# Patient Record
Sex: Female | Born: 1960
Health system: Southern US, Community
[De-identification: ages and names within clinical notes are randomized; demographics above are authoritative.]

## PROBLEM LIST (undated history)

## (undated) DIAGNOSIS — B379 Candidiasis, unspecified: Secondary | ICD-10-CM

## (undated) DIAGNOSIS — C539 Malignant neoplasm of cervix uteri, unspecified: Secondary | ICD-10-CM

## (undated) DIAGNOSIS — E559 Vitamin D deficiency, unspecified: Secondary | ICD-10-CM

## (undated) DIAGNOSIS — L669 Cicatricial alopecia, unspecified: Secondary | ICD-10-CM

## (undated) HISTORY — DX: Cicatricial alopecia, unspecified: L66.9

## (undated) HISTORY — DX: Malignant neoplasm of cervix uteri, unspecified: C53.9

## (undated) HISTORY — PX: ABDOMINAL HYSTERECTOMY: SHX81

## (undated) HISTORY — DX: Vitamin D deficiency, unspecified: E55.9

## (undated) HISTORY — DX: Candidiasis, unspecified: B37.9

## (undated) HISTORY — PX: OTHER SURGICAL HISTORY: SHX169

---

## 1996-10-31 DIAGNOSIS — C539 Malignant neoplasm of cervix uteri, unspecified: Secondary | ICD-10-CM

## 1996-10-31 HISTORY — DX: Malignant neoplasm of cervix uteri, unspecified: C53.9

## 1997-04-02 HISTORY — PX: CERVICAL CONE BIOPSY: SUR198

## 1997-10-08 HISTORY — PX: TOTAL VAGINAL HYSTERECTOMY: SHX2548

## 1999-07-29 ENCOUNTER — Other Ambulatory Visit: Admission: RE | Admit: 1999-07-29 | Discharge: 1999-07-29 | Payer: Self-pay | Admitting: Obstetrics and Gynecology

## 2000-01-24 ENCOUNTER — Other Ambulatory Visit: Admission: RE | Admit: 2000-01-24 | Discharge: 2000-01-24 | Payer: Self-pay | Admitting: *Deleted

## 2000-02-14 ENCOUNTER — Ambulatory Visit (HOSPITAL_COMMUNITY): Admission: RE | Admit: 2000-02-14 | Discharge: 2000-02-14 | Payer: Self-pay | Admitting: Obstetrics and Gynecology

## 2000-02-14 ENCOUNTER — Encounter: Payer: Self-pay | Admitting: Obstetrics and Gynecology

## 2001-01-23 ENCOUNTER — Other Ambulatory Visit: Admission: RE | Admit: 2001-01-23 | Discharge: 2001-01-23 | Payer: Self-pay | Admitting: Obstetrics and Gynecology

## 2001-01-25 ENCOUNTER — Encounter: Payer: Self-pay | Admitting: Obstetrics and Gynecology

## 2001-01-25 ENCOUNTER — Encounter: Admission: RE | Admit: 2001-01-25 | Discharge: 2001-01-25 | Payer: Self-pay | Admitting: Obstetrics and Gynecology

## 2001-08-21 ENCOUNTER — Encounter: Payer: Self-pay | Admitting: Obstetrics and Gynecology

## 2001-08-21 ENCOUNTER — Encounter: Admission: RE | Admit: 2001-08-21 | Discharge: 2001-08-21 | Payer: Self-pay | Admitting: Obstetrics and Gynecology

## 2002-01-24 ENCOUNTER — Other Ambulatory Visit: Admission: RE | Admit: 2002-01-24 | Discharge: 2002-01-24 | Payer: Self-pay | Admitting: Obstetrics and Gynecology

## 2002-04-29 ENCOUNTER — Encounter: Admission: RE | Admit: 2002-04-29 | Discharge: 2002-04-29 | Payer: Self-pay | Admitting: Obstetrics and Gynecology

## 2002-04-29 ENCOUNTER — Encounter: Payer: Self-pay | Admitting: Obstetrics and Gynecology

## 2003-01-29 ENCOUNTER — Other Ambulatory Visit: Admission: RE | Admit: 2003-01-29 | Discharge: 2003-01-29 | Payer: Self-pay | Admitting: Obstetrics and Gynecology

## 2003-01-30 HISTORY — PX: COLPOSCOPY: SHX161

## 2003-05-20 ENCOUNTER — Encounter: Payer: Self-pay | Admitting: Obstetrics and Gynecology

## 2003-05-20 ENCOUNTER — Encounter: Admission: RE | Admit: 2003-05-20 | Discharge: 2003-05-20 | Payer: Self-pay | Admitting: Obstetrics and Gynecology

## 2003-09-08 ENCOUNTER — Other Ambulatory Visit: Admission: RE | Admit: 2003-09-08 | Discharge: 2003-09-08 | Payer: Self-pay | Admitting: Obstetrics and Gynecology

## 2004-03-15 ENCOUNTER — Other Ambulatory Visit: Admission: RE | Admit: 2004-03-15 | Discharge: 2004-03-15 | Payer: Self-pay | Admitting: Obstetrics and Gynecology

## 2004-05-28 ENCOUNTER — Encounter: Admission: RE | Admit: 2004-05-28 | Discharge: 2004-05-28 | Payer: Self-pay | Admitting: Obstetrics and Gynecology

## 2005-06-16 ENCOUNTER — Encounter: Admission: RE | Admit: 2005-06-16 | Discharge: 2005-06-16 | Payer: Self-pay | Admitting: Obstetrics and Gynecology

## 2006-04-28 ENCOUNTER — Other Ambulatory Visit: Admission: RE | Admit: 2006-04-28 | Discharge: 2006-04-28 | Payer: Self-pay | Admitting: Obstetrics & Gynecology

## 2006-06-26 ENCOUNTER — Encounter: Admission: RE | Admit: 2006-06-26 | Discharge: 2006-06-26 | Payer: Self-pay | Admitting: Obstetrics and Gynecology

## 2006-10-31 DIAGNOSIS — E559 Vitamin D deficiency, unspecified: Secondary | ICD-10-CM

## 2006-10-31 HISTORY — DX: Vitamin D deficiency, unspecified: E55.9

## 2007-05-07 ENCOUNTER — Other Ambulatory Visit: Admission: RE | Admit: 2007-05-07 | Discharge: 2007-05-07 | Payer: Self-pay | Admitting: Obstetrics and Gynecology

## 2007-07-09 ENCOUNTER — Encounter: Admission: RE | Admit: 2007-07-09 | Discharge: 2007-07-09 | Payer: Self-pay | Admitting: Obstetrics and Gynecology

## 2008-05-08 ENCOUNTER — Other Ambulatory Visit: Admission: RE | Admit: 2008-05-08 | Discharge: 2008-05-08 | Payer: Self-pay | Admitting: Obstetrics & Gynecology

## 2008-05-20 ENCOUNTER — Ambulatory Visit: Payer: Self-pay | Admitting: Sports Medicine

## 2008-05-20 DIAGNOSIS — M2550 Pain in unspecified joint: Secondary | ICD-10-CM | POA: Insufficient documentation

## 2008-05-20 DIAGNOSIS — M7750 Other enthesopathy of unspecified foot: Secondary | ICD-10-CM

## 2008-05-20 DIAGNOSIS — M25579 Pain in unspecified ankle and joints of unspecified foot: Secondary | ICD-10-CM

## 2008-05-20 DIAGNOSIS — M2020 Hallux rigidus, unspecified foot: Secondary | ICD-10-CM

## 2009-01-06 ENCOUNTER — Encounter: Admission: RE | Admit: 2009-01-06 | Discharge: 2009-01-06 | Payer: Self-pay | Admitting: Obstetrics and Gynecology

## 2010-01-11 ENCOUNTER — Encounter: Admission: RE | Admit: 2010-01-11 | Discharge: 2010-01-11 | Payer: Self-pay | Admitting: Obstetrics and Gynecology

## 2011-01-11 ENCOUNTER — Other Ambulatory Visit: Payer: Self-pay | Admitting: Obstetrics and Gynecology

## 2011-01-11 DIAGNOSIS — Z1231 Encounter for screening mammogram for malignant neoplasm of breast: Secondary | ICD-10-CM

## 2011-01-14 ENCOUNTER — Ambulatory Visit
Admission: RE | Admit: 2011-01-14 | Discharge: 2011-01-14 | Disposition: A | Discharge status: Home / Self Care | Payer: BC Managed Care – PPO | Source: Ambulatory Visit | Attending: Obstetrics and Gynecology | Admitting: Obstetrics and Gynecology

## 2011-01-14 DIAGNOSIS — Z1231 Encounter for screening mammogram for malignant neoplasm of breast: Secondary | ICD-10-CM

## 2011-07-02 HISTORY — PX: COLONOSCOPY: SHX174

## 2011-12-15 ENCOUNTER — Other Ambulatory Visit: Payer: Self-pay | Admitting: Obstetrics and Gynecology

## 2011-12-15 DIAGNOSIS — Z1231 Encounter for screening mammogram for malignant neoplasm of breast: Secondary | ICD-10-CM

## 2012-01-16 ENCOUNTER — Ambulatory Visit
Admission: RE | Admit: 2012-01-16 | Discharge: 2012-01-16 | Disposition: A | Discharge status: Home / Self Care | Payer: 59 | Source: Ambulatory Visit | Attending: Obstetrics and Gynecology | Admitting: Obstetrics and Gynecology

## 2012-01-16 DIAGNOSIS — Z1231 Encounter for screening mammogram for malignant neoplasm of breast: Secondary | ICD-10-CM

## 2012-05-29 LAB — HM PAP SMEAR: HM Pap smear: NEGATIVE

## 2012-12-21 ENCOUNTER — Other Ambulatory Visit: Payer: Self-pay | Admitting: Nurse Practitioner

## 2012-12-21 DIAGNOSIS — Z1231 Encounter for screening mammogram for malignant neoplasm of breast: Secondary | ICD-10-CM

## 2013-01-16 ENCOUNTER — Ambulatory Visit
Admission: RE | Admit: 2013-01-16 | Discharge: 2013-01-16 | Disposition: A | Discharge status: Home / Self Care | Payer: 59 | Source: Ambulatory Visit | Attending: Nurse Practitioner | Admitting: Nurse Practitioner

## 2013-01-16 DIAGNOSIS — Z1231 Encounter for screening mammogram for malignant neoplasm of breast: Secondary | ICD-10-CM

## 2013-06-14 ENCOUNTER — Encounter: Payer: Self-pay | Admitting: *Deleted

## 2013-06-18 ENCOUNTER — Encounter: Payer: Self-pay | Admitting: Nurse Practitioner

## 2013-06-18 ENCOUNTER — Ambulatory Visit (INDEPENDENT_AMBULATORY_CARE_PROVIDER_SITE_OTHER): Payer: 59 | Admitting: Nurse Practitioner

## 2013-06-18 VITALS — BP 112/50 | HR 66 | Resp 12 | Ht 68.5 in | Wt 125.8 lb

## 2013-06-18 DIAGNOSIS — Z0000 Encounter for general adult medical examination without abnormal findings: Secondary | ICD-10-CM

## 2013-06-18 DIAGNOSIS — Z01419 Encounter for gynecological examination (general) (routine) without abnormal findings: Secondary | ICD-10-CM

## 2013-06-18 LAB — POCT URINALYSIS DIPSTICK: Spec Grav, UA: 1.015

## 2013-06-18 NOTE — Progress Notes (Signed)
52 y.o. G0P0 Married Caucasian Fe here for annual exam.  No new problems. Some vaginal dryness, rare if any vaso symptoms.  She has bilateral foot pain at the base of first metarsals.  She would like further evaluation.  Mother still living with her and has an Proofreader for 4 hours daily.  No LMP recorded. Patient has had a hysterectomy.          Sexually active: yes  The current method of family planning is status post hysterectomy.    Exercising: yes  cardio and yoga  Smoker:  no  Health Maintenance: Pap:  05/29/2012  Negative  MMG:  03/2013 normal Colonoscopy:  07/2011 normal BMD:   never TDaP:  05/24/2011 Labs: Hgb- 13.3    reports that she has never smoked. She has never used smokeless tobacco. She reports that she drinks about 2.5 ounces of alcohol per week. She reports that she does not use illicit drugs.  Past Medical History  Diagnosis Date  . Vitamin D deficiency   . Yeast infection     chronic yeast infection   . Cervical cancer     Past Surgical History  Procedure Laterality Date  . Total vaginal hysterectomy  1998  . Colposcopy      Current Outpatient Prescriptions  Medication Sig Dispense Refill  . Biotin 10 MG CAPS Take by mouth.      . Calcium Carb-Cholecalciferol (CALCIUM 1000 + D PO) Take by mouth.      . cholecalciferol (VITAMIN D) 1000 UNITS tablet Take 1,000 Units by mouth daily.      . fish oil-omega-3 fatty acids 1000 MG capsule Take 2 g by mouth daily.      . Multiple Vitamin (MULTIVITAMIN) tablet Take 1 tablet by mouth daily.       No current facility-administered medications for this visit.    Family History  Problem Relation Age of Onset  . Hypertension Maternal Aunt   . Cancer Paternal Aunt     ovarian cancer   . Diabetes Maternal Grandmother   . Diabetes Paternal Grandmother     ROS:  Pertinent items are noted in HPI.  Otherwise, a comprehensive ROS was negative.  Exam:   BP 112/50  Pulse 66  Resp 12  Ht 5' 8.5" (1.74 m)  Wt  125 lb 12.8 oz (57.063 kg)  BMI 18.85 kg/m2 Height: 5' 8.5" (174 cm)  Ht Readings from Last 3 Encounters:  06/18/13 5' 8.5" (1.74 m)  05/20/08 5\' 9"  (1.753 m)    General appearance: alert, cooperative and appears stated age Head: Normocephalic, without obvious abnormality, atraumatic Neck: no adenopathy, supple, symmetrical, trachea midline and thyroid normal to inspection and palpation Lungs: clear to auscultation bilaterally Breasts: normal appearance, no masses or tenderness Heart: regular rate and rhythm Abdomen: soft, non-tender; no masses,  no organomegaly Extremities: extremities normal, atraumatic, no cyanosis or edema, bilateral calcifications at first metatarsals.  Skin: Skin color, texture, turgor normal. No rashes or lesions Lymph nodes: Cervical, supraclavicular, and axillary nodes normal. No abnormal inguinal nodes palpated Neurologic: Grossly normal   Pelvic: External genitalia:  no lesions              Urethra:  normal appearing urethra with no masses, tenderness or lesions              Bartholin's and Skene's: normal                 Vagina: normal appearing vagina with normal color and  discharge, no lesions              Cervix: absent              Pap taken: yes Bimanual Exam:  Uterus:  uterus absent              Adnexa: no mass, fullness, tenderness               Rectovaginal: Confirms               Anus:  normal sphincter tone, no lesions  A:  Well Woman with normal exam  S/P TVH secondary to CIS 1998  S/P Colpo Biopsy 4/04 - VAIN I  Bilateral foot pain  P:   Pap smear as per guidelines For 20 years  Mammogram due 03/2014  Given the name of [facility] to call about foot pain.  Counseled on breast self exam, adequate intake of calcium and vitamin D, diet and exercise return annually or prn  An After Visit Summary was printed and given to the patient.

## 2013-06-18 NOTE — Patient Instructions (Addendum)

## 2013-06-19 NOTE — Progress Notes (Signed)
Encounter reviewed by Dr. Brook Silva.  

## 2013-07-10 ENCOUNTER — Telehealth: Payer: Self-pay | Admitting: Nurse Practitioner

## 2013-07-10 NOTE — Telephone Encounter (Signed)
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 Please call and discuss this with patient . Patient said she informed us that she needed [place] to send it to [facility]. Lab wasn't aware.

## 2013-07-23 ENCOUNTER — Telehealth: Payer: Self-pay | Admitting: Nurse Practitioner

## 2013-07-23 NOTE — Telephone Encounter (Signed)
patient was informed that we send pap's to [facility] since we know the pathologist.  But if a patient states only [facility] we can call a courier for it to be sent there. She understands and is appreciative.

## 2013-07-23 NOTE — Telephone Encounter (Signed)
See next phone note.

## 2014-01-01 ENCOUNTER — Other Ambulatory Visit: Payer: Self-pay

## 2014-01-01 DIAGNOSIS — Z1231 Encounter for screening mammogram for malignant neoplasm of breast: Secondary | ICD-10-CM

## 2014-01-21 ENCOUNTER — Ambulatory Visit
Admission: RE | Admit: 2014-01-21 | Discharge: 2014-01-21 | Disposition: A | Discharge status: Home / Self Care | Payer: 59 | Source: Ambulatory Visit

## 2014-01-21 DIAGNOSIS — Z1231 Encounter for screening mammogram for malignant neoplasm of breast: Secondary | ICD-10-CM

## 2014-06-23 ENCOUNTER — Ambulatory Visit (INDEPENDENT_AMBULATORY_CARE_PROVIDER_SITE_OTHER): Payer: 59 | Admitting: Nurse Practitioner

## 2014-06-23 ENCOUNTER — Encounter: Payer: Self-pay | Admitting: Nurse Practitioner

## 2014-06-23 VITALS — BP 116/72 | HR 60 | Ht 68.25 in | Wt 126.0 lb

## 2014-06-23 DIAGNOSIS — Z0000 Encounter for general adult medical examination without abnormal findings: Secondary | ICD-10-CM

## 2014-06-23 DIAGNOSIS — Z01419 Encounter for gynecological examination (general) (routine) without abnormal findings: Secondary | ICD-10-CM

## 2014-06-23 LAB — POCT URINALYSIS DIPSTICK
Bilirubin, UA: NEGATIVE
GLUCOSE UA: NEGATIVE
Ketones, UA: NEGATIVE
NITRITE UA: NEGATIVE
Protein, UA: NEGATIVE
RBC UA: NEGATIVE
UROBILINOGEN UA: NEGATIVE
pH, UA: 6

## 2014-06-23 NOTE — Patient Instructions (Signed)

## 2014-06-23 NOTE — Progress Notes (Signed)
53 y.o. G0P0 Married Caucasian Fe here for annual exam.  No new health diagnosis. She feels well.  Patient's last menstrual period was 09/30/1997.          Sexually active: Yes.    The current method of family planning is status post hysterectomy.    Exercising: Yes.    Home exercise routine includes jogging and yoga. Smoker:  no  Health Maintenance: Pap:  06/18/13  HR HPV neg MMG:  01/23/14 Bi-Rads Neg Colonoscopy:  07/2011 Normal BMD:   never TDaP:  05/24/11 Labs: Work             UA: WBC Trace ph: 6.0 no symptoms.   reports that she has never smoked. She has never used smokeless tobacco. She reports that she drinks about 2.5 ounces of alcohol per week. She reports that she does not use illicit drugs.  Past Medical History  Diagnosis Date  . Vitamin D deficiency 2008  . Yeast infection     chronic yeast infection   . Cervical cancer 1998    CIS    Past Surgical History  Procedure Laterality Date  . Total vaginal hysterectomy  1998    CIS of cervix  . Colposcopy  4/04    vain I  . Colonoscopy  07/2011    normal repeat in 10 years    Current Outpatient Prescriptions  Medication Sig Dispense Refill  . Biotin 10 MG CAPS Take by mouth.      . Calcium Carb-Cholecalciferol (CALCIUM 1000 + D PO) Take by mouth.      . cholecalciferol (VITAMIN D) 1000 UNITS tablet Take 1,000 Units by mouth daily.      . fish oil-omega-3 fatty acids 1000 MG capsule Take 2 g by mouth daily.      . Multiple Vitamin (MULTIVITAMIN) tablet Take 1 tablet by mouth daily.       No current facility-administered medications for this visit.    Family History  Problem Relation Age of Onset  . Hypertension Maternal Aunt   . Cancer Paternal Aunt     ovarian cancer   . Diabetes Maternal Grandmother   . Diabetes Paternal Grandmother   . Thyroid disease Mother   . Heart failure Paternal Grandfather     ROS:  Pertinent items are noted in HPI.  Otherwise, a comprehensive ROS was negative.  Exam:   BP  116/72  Pulse 60  Ht 5' 8.25" (1.734 m)  Wt 126 lb (57.153 kg)  BMI 19.01 kg/m2  LMP 09/30/1997 Height: 5' 8.25" (173.4 cm)  Ht Readings from Last 3 Encounters:  06/23/14 5' 8.25" (1.734 m)  06/18/13 5' 8.5" (1.74 m)  05/20/08 5\' 9"  (1.753 m)    General appearance: alert, cooperative and appears stated age Head: Normocephalic, without obvious abnormality, atraumatic Neck: no adenopathy, supple, symmetrical, trachea midline and thyroid normal to inspection and palpation Lungs: clear to auscultation bilaterally Breasts: normal appearance, no masses or tenderness Heart: regular rate and rhythm Abdomen: soft, non-tender; no masses,  no organomegaly Extremities: extremities normal, atraumatic, no cyanosis or edema Skin: Skin color, texture, turgor normal. No rashes or lesions Lymph nodes: Cervical, supraclavicular, and axillary nodes normal. No abnormal inguinal nodes palpated Neurologic: Grossly normal   Pelvic: External genitalia:  no lesions              Urethra:  normal appearing urethra with no masses, tenderness or lesions              Bartholin's and  Skene's: normal                 Vagina: some atrophic changes appearing vagina with normal color and discharge, no lesions              Cervix: absent              Pap taken: Yes.   Bimanual Exam:  Uterus:  uterus absent              Adnexa: no mass, fullness, tenderness               Rectovaginal: Confirms               Anus:  normal sphincter tone, no lesions  A:  Well Woman with normal exam  S/P CIS with TVH 1998 (pap for 20 years)    P:   Reviewed health and wellness pertinent to exam  Pap smear taken today  Mammogram is due 3/16  Counseled on breast self exam, mammography screening, adequate intake of calcium and vitamin D, diet and exercise return annually or prn  An After Visit Summary was printed and given to the patient.

## 2014-06-27 LAB — IPS PAP TEST WITH REFLEX TO HPV

## 2014-06-29 NOTE — Progress Notes (Signed)
Encounter reviewed by Dr. Brook Silva.  

## 2014-12-23 ENCOUNTER — Other Ambulatory Visit: Payer: Self-pay

## 2014-12-23 DIAGNOSIS — Z1231 Encounter for screening mammogram for malignant neoplasm of breast: Secondary | ICD-10-CM

## 2015-01-30 HISTORY — PX: METATARSAL OSTEOTOMY: SHX1641

## 2015-02-06 ENCOUNTER — Ambulatory Visit: Payer: Self-pay

## 2015-02-18 ENCOUNTER — Ambulatory Visit: Payer: Self-pay

## 2015-02-19 ENCOUNTER — Ambulatory Visit
Admit: 2015-02-19 | Disposition: A | Discharge status: Home / Self Care | Payer: Self-pay | Attending: Podiatry | Admitting: Podiatry

## 2015-02-23 LAB — SURGICAL PATHOLOGY

## 2015-03-09 ENCOUNTER — Ambulatory Visit
Admission: RE | Admit: 2015-03-09 | Discharge: 2015-03-09 | Disposition: A | Discharge status: Home / Self Care | Payer: 59 | Source: Ambulatory Visit

## 2015-03-09 DIAGNOSIS — Z1231 Encounter for screening mammogram for malignant neoplasm of breast: Secondary | ICD-10-CM

## 2015-06-25 ENCOUNTER — Ambulatory Visit: Payer: Self-pay | Admitting: Nurse Practitioner

## 2015-06-29 ENCOUNTER — Encounter: Payer: Self-pay | Admitting: Nurse Practitioner

## 2015-06-29 ENCOUNTER — Ambulatory Visit (INDEPENDENT_AMBULATORY_CARE_PROVIDER_SITE_OTHER): Payer: 59 | Admitting: Nurse Practitioner

## 2015-06-29 VITALS — BP 90/62 | HR 56 | Ht 68.5 in | Wt 128.0 lb

## 2015-06-29 DIAGNOSIS — Z0000 Encounter for general adult medical examination without abnormal findings: Secondary | ICD-10-CM | POA: Diagnosis not present

## 2015-06-29 DIAGNOSIS — Z01419 Encounter for gynecological examination (general) (routine) without abnormal findings: Secondary | ICD-10-CM | POA: Diagnosis not present

## 2015-06-29 LAB — POCT URINALYSIS DIPSTICK
BILIRUBIN UA: NEGATIVE
Blood, UA: NEGATIVE
Glucose, UA: NEGATIVE
Ketones, UA: NEGATIVE
LEUKOCYTES UA: NEGATIVE
NITRITE UA: NEGATIVE
PH UA: 5
PROTEIN UA: NEGATIVE
UROBILINOGEN UA: NEGATIVE

## 2015-06-29 NOTE — Progress Notes (Signed)
Patient ID: Natasha Mccann, female   DOB: 12-Aug-1961, 54 y.o.   MRN: [id number] 54 y.o. G0P0 Married  Caucasian Fe here for annual exam.  She feels well.   The only new problem this past year was a repair of  Hallux rigidus of left foot with condylectomy and metatarsal osteotomy.  She normally gets labs done thru work but does not get thyroid checked.  Will get Thryroid panel and TSH at [facility] and have results sent here.  Patient's last menstrual period was 09/30/1997 (approximate).          Sexually active: Yes.    The current method of family planning is status post hysterectomy.    Exercising: Yes.    running 3 miles 5 days per week, yoga and pilates Smoker:  no  Health Maintenance: Pap: 06/24/14, negative (previous neg HR HPV) MMG: 03/09/15, 3D, Bi-Rads 1:  Negative Colonoscopy: 07/2011 Normal, repeat in 10 years TDaP: 05/24/11 Labs: Work   Urine:  Negative     reports that she has never smoked. She has never used smokeless tobacco. She reports that she drinks about 2.5 oz of alcohol per week. She reports that she does not use illicit drugs.  Past Medical History  Diagnosis Date  . Vitamin D deficiency 2008  . Yeast infection     chronic yeast infection   . Cervical cancer 1998    CIS    Past Surgical History  Procedure Laterality Date  . Total vaginal hysterectomy  1998    CIS of cervix  . Colposcopy  4/04    vain I  . Colonoscopy  07/2011    normal repeat in 10 years  . Metatarsal osteotomy Left 01/2015    repair of Hallux rigidus with Condylectomy    Current Outpatient Prescriptions  Medication Sig Dispense Refill  . Biotin 10 MG CAPS Take by mouth.    . Calcium Carb-Cholecalciferol (CALCIUM 1000 + D PO) Take by mouth.    . cholecalciferol (VITAMIN D) 1000 UNITS tablet Take 1,000 Units by mouth daily.    . fish oil-omega-3 fatty acids 1000 MG capsule Take 2 g by mouth daily.    . Multiple Vitamin (MULTIVITAMIN) tablet Take 1 tablet by mouth daily.     No current  facility-administered medications for this visit.    Family History  Problem Relation Age of Onset  . Hypertension Maternal Aunt   . Pancreatic cancer Maternal Aunt 92  . Cancer Paternal Aunt     ovarian cancer   . Diabetes Maternal Grandmother   . Diabetes Paternal Grandmother   . Thyroid disease Mother   . Heart failure Paternal Grandfather   . Stomach cancer Cousin 35    ROS:  Pertinent items are noted in HPI.  Otherwise, a comprehensive ROS was negative.  Exam:   BP 90/62 mmHg  Pulse 56  Ht 5' 8.5" (1.74 m)  Wt 128 lb (58.06 kg)  BMI 19.18 kg/m2  LMP 09/30/1997 (Approximate) Height: 5' 8.5" (174 cm) Ht Readings from Last 3 Encounters:  06/29/15 5' 8.5" (1.74 m)  06/23/14 5' 8.25" (1.734 m)  06/18/13 5' 8.5" (1.74 m)    General appearance: alert, cooperative and appears stated age Head: Normocephalic, without obvious abnormality, atraumatic Neck: no adenopathy, supple, symmetrical, trachea midline and thyroid normal to inspection and palpation Lungs: clear to auscultation bilaterally Breasts: normal appearance, no masses or tenderness Heart: regular rate and rhythm Abdomen: soft, non-tender; no masses,  no organomegaly Extremities: extremities normal, atraumatic,  no cyanosis or edema Skin: Skin color, texture, turgor normal. No rashes or lesions Lymph nodes: Cervical, supraclavicular, and axillary nodes normal. No abnormal inguinal nodes palpated Neurologic: Grossly normal   Pelvic: External genitalia:  no lesions              Urethra:  normal appearing urethra with no masses, tenderness or lesions              Bartholin's and Skene's: normal                 Vagina: normal appearing vagina with normal color and discharge, no lesions              Cervix: absent              Pap taken: Yes.   Bimanual Exam:  Uterus:  uterus absent              Adnexa: no mass, fullness, tenderness               Rectovaginal: Confirms               Anus:  normal sphincter tone,  no lesions  Chaperone present: yes  A:  Well Woman with normal exam  S/P CIS with TVH 09/1997 (pap for 20 years)  S/P left foot surgery 01/2015  P:   Reviewed health and wellness pertinent to exam  Pap smear as above  Mammogram is due 5/17  Counseled on breast self exam, mammography screening, adequate intake of calcium and vitamin D, diet and exercise return annually or prn  An After Visit Summary was printed and given to the patient.

## 2015-06-29 NOTE — Patient Instructions (Signed)

## 2015-06-30 ENCOUNTER — Encounter: Payer: Self-pay | Admitting: Nurse Practitioner

## 2015-07-01 LAB — IPS PAP TEST WITH HPV

## 2015-07-01 NOTE — Progress Notes (Signed)
Encounter reviewed by Dr. Brook Amundson C. Silva.  

## 2016-02-03 ENCOUNTER — Other Ambulatory Visit: Payer: Self-pay

## 2016-02-03 DIAGNOSIS — Z1231 Encounter for screening mammogram for malignant neoplasm of breast: Secondary | ICD-10-CM

## 2016-03-11 ENCOUNTER — Ambulatory Visit
Admission: RE | Admit: 2016-03-11 | Discharge: 2016-03-11 | Disposition: A | Discharge status: Home / Self Care | Payer: 59 | Source: Ambulatory Visit

## 2016-03-11 DIAGNOSIS — Z1231 Encounter for screening mammogram for malignant neoplasm of breast: Secondary | ICD-10-CM

## 2016-04-30 DIAGNOSIS — L669 Cicatricial alopecia, unspecified: Secondary | ICD-10-CM

## 2016-04-30 HISTORY — DX: Cicatricial alopecia, unspecified: L66.9

## 2016-06-30 ENCOUNTER — Encounter: Payer: Self-pay | Admitting: Nurse Practitioner

## 2016-06-30 NOTE — Progress Notes (Signed)
Patient ID: Natasha Mccann, female   DOB: 1961/01/21, 55 y.o.   MRN: [id number]  55 y.o. G0P0000 Married  Caucasian Fe here for annual exam.  Diagnosed with lichen planus with hair loss with a biopsy.  She was given Plaquenil but decided against due to potential SE.  Mothers dementia is getting worse she is 76.  Patient's last menstrual period was 09/30/1997 (approximate).          Sexually active: Yes.    The current method of family planning is status post hysterectomy.    Exercising: Yes.  Running 3 miles 4 days per week, piliates and yoga Smoker:  no  Health Maintenance: Pap:06/29/15, Negative with neg HR HPV MMG: 03/11/16, 3D, Bi-Rads 1: Negative  Colonoscopy:07/2011 Normal will get done this year [id number] TDaP:05/24/11 Shingles: Not indicated due to age Pneumonia: Not indicated due to age Hep C and HIV: No Labs: work Musician   reports that she has never smoked. She has never used smokeless tobacco. She reports that she drinks about 2.5 oz of alcohol per week . She reports that she does not use drugs.  Past Medical History:  Diagnosis Date  . Cervical cancer ([place]) 1998   CIS  . Scarring alopecia 04/2016  . Vitamin D deficiency 2008  . Yeast infection    chronic yeast infection     Past Surgical History:  Procedure Laterality Date  . CERVICAL CONE BIOPSY  04/02/1997   Squamous cell caarcinoma in situ of cervix  . COLONOSCOPY  07/2011   normal repeat in 10 years  . COLPOSCOPY  4/04   vain I  . METATARSAL OSTEOTOMY Left 01/2015   repair of Hallux rigidus with Condylectomy  . TOTAL VAGINAL HYSTERECTOMY  10/08/1997   CIS [facility] cervix, [facility]    Current Outpatient Prescriptions  Medication Sig Dispense Refill  . Biotin 10 MG CAPS Take by mouth.    . Calcium Carb-Cholecalciferol (CALCIUM 1000 + D PO) Take by mouth.    . cholecalciferol (VITAMIN D) 1000 UNITS tablet Take 1,000 Units by mouth daily.    . clobetasol (OLUX) 0.05 % topical foam     . fish  oil-omega-3 fatty acids 1000 MG capsule Take 2 g by mouth daily.    Marland Kitchen HALOG 0.1 % CREA     . Multiple Vitamin (MULTIVITAMIN) tablet Take 1 tablet by mouth daily.     No current facility-administered medications for this visit.     Family History  Problem Relation Age of Onset  . Hypertension Maternal Aunt   . Pancreatic cancer Maternal Aunt 92  . Thyroid disease Mother   . Stomach cancer Cousin 66  . Cancer Paternal Aunt     ovarian cancer   . Diabetes Maternal Grandmother   . Diabetes Paternal Grandmother   . Heart failure Paternal Grandfather     ROS:  Pertinent items are noted in HPI.  Otherwise, a comprehensive ROS was negative.  Exam:   BP 94/62 (BP Location: Right Arm, Patient Position: Sitting, Cuff Size: Normal)   Pulse 60   Ht 5\' 8"  (1.727 m)   Wt 126 lb (57.2 kg)   LMP 09/30/1997 (Approximate)   BMI 19.16 kg/m  Height: 5\' 8"  (172.7 cm) Ht Readings from Last 3 Encounters:  07/01/16 5\' 8"  (1.727 m)  06/29/15 5' 8.5" (1.74 m)  06/23/14 5' 8.25" (1.734 m)    General appearance: alert, cooperative and appears stated age Head: Normocephalic, without obvious abnormality, atraumatic Neck: no adenopathy, supple,  symmetrical, trachea midline and thyroid normal to inspection and palpation Lungs: clear to auscultation bilaterally Breasts: normal appearance, no masses or tenderness Heart: regular rate and rhythm Abdomen: soft, non-tender; no masses,  no organomegaly Extremities: extremities normal, atraumatic, no cyanosis or edema Skin: Skin color, texture, turgor normal. No rashes or lesions Lymph nodes: Cervical, supraclavicular, and axillary nodes normal. No abnormal inguinal nodes palpated Neurologic: Grossly normal   Pelvic: External genitalia:  no lesions              Urethra:  normal appearing urethra with no masses, tenderness or lesions              Bartholin's and Skene's: normal                 Vagina: normal appearing vagina with normal color and  discharge, no lesions              Cervix: absent              Pap taken: Yes.   Bimanual Exam:  Uterus:  uterus absent              Adnexa: no mass, fullness, tenderness               Rectovaginal: Confirms               Anus:  normal sphincter tone, no lesions  Chaperone present: yes  A:  Well Woman with normal exam  S/P CIS with TVH 09/1997 (pap for 20 years)             S/P left foot surgery 01/2015  P:   Reviewed health and wellness pertinent to exam  Pap smear sent to [facility] is due 02/2017  Counseled on breast self exam, adequate intake of calcium and vitamin D, diet and exercise return annually or prn  An After Visit Summary was printed and given to the patient.

## 2016-07-01 ENCOUNTER — Encounter: Payer: Self-pay | Admitting: Nurse Practitioner

## 2016-07-01 ENCOUNTER — Ambulatory Visit (INDEPENDENT_AMBULATORY_CARE_PROVIDER_SITE_OTHER): Payer: 59 | Admitting: Nurse Practitioner

## 2016-07-01 VITALS — BP 94/62 | HR 60 | Ht 68.0 in | Wt 126.0 lb

## 2016-07-01 DIAGNOSIS — Z01419 Encounter for gynecological examination (general) (routine) without abnormal findings: Secondary | ICD-10-CM | POA: Diagnosis not present

## 2016-07-01 DIAGNOSIS — Z0000 Encounter for general adult medical examination without abnormal findings: Secondary | ICD-10-CM

## 2016-07-01 NOTE — Patient Instructions (Signed)

## 2016-07-01 NOTE — Progress Notes (Signed)
Reviewed personally.  M. Suzanne Robie Mcniel, MD.  

## 2016-07-08 LAB — PAP IG AND HPV HIGH-RISK
HPV, HIGH-RISK: NEGATIVE
PAP Smear Comment: 0

## 2017-02-15 ENCOUNTER — Other Ambulatory Visit: Payer: Self-pay | Admitting: Nurse Practitioner

## 2017-02-15 DIAGNOSIS — Z1231 Encounter for screening mammogram for malignant neoplasm of breast: Secondary | ICD-10-CM

## 2017-03-20 ENCOUNTER — Ambulatory Visit: Payer: Self-pay

## 2017-05-15 ENCOUNTER — Telehealth: Payer: Self-pay | Admitting: Obstetrics and Gynecology

## 2017-05-15 NOTE — Telephone Encounter (Signed)
LMTCB/:NP/ .CX/LETTER SENT/RD

## 2017-06-12 ENCOUNTER — Ambulatory Visit
Admission: RE | Admit: 2017-06-12 | Discharge: 2017-06-12 | Disposition: A | Discharge status: Home / Self Care | Payer: BLUE CROSS/BLUE SHIELD | Source: Ambulatory Visit | Attending: Nurse Practitioner | Admitting: Nurse Practitioner

## 2017-06-12 DIAGNOSIS — Z1231 Encounter for screening mammogram for malignant neoplasm of breast: Secondary | ICD-10-CM

## 2017-06-14 ENCOUNTER — Other Ambulatory Visit: Payer: Self-pay | Admitting: Nurse Practitioner

## 2017-06-14 ENCOUNTER — Other Ambulatory Visit: Payer: Self-pay | Admitting: Obstetrics and Gynecology

## 2017-06-15 ENCOUNTER — Other Ambulatory Visit: Payer: Self-pay | Admitting: Obstetrics and Gynecology

## 2017-06-15 DIAGNOSIS — R928 Other abnormal and inconclusive findings on diagnostic imaging of breast: Secondary | ICD-10-CM

## 2017-06-19 ENCOUNTER — Ambulatory Visit: Payer: Self-pay

## 2017-06-19 ENCOUNTER — Ambulatory Visit
Admission: RE | Admit: 2017-06-19 | Discharge: 2017-06-19 | Disposition: A | Discharge status: Home / Self Care | Payer: BLUE CROSS/BLUE SHIELD | Source: Ambulatory Visit | Attending: Obstetrics and Gynecology | Admitting: Obstetrics and Gynecology

## 2017-06-19 DIAGNOSIS — R928 Other abnormal and inconclusive findings on diagnostic imaging of breast: Secondary | ICD-10-CM

## 2017-07-05 ENCOUNTER — Ambulatory Visit (INDEPENDENT_AMBULATORY_CARE_PROVIDER_SITE_OTHER): Payer: BLUE CROSS/BLUE SHIELD | Admitting: Certified Nurse Midwife

## 2017-07-05 ENCOUNTER — Other Ambulatory Visit (HOSPITAL_COMMUNITY)
Admission: RE | Admit: 2017-07-05 | Discharge: 2017-07-05 | Disposition: A | Discharge status: Home / Self Care | Payer: BLUE CROSS/BLUE SHIELD | Source: Ambulatory Visit | Attending: Obstetrics & Gynecology | Admitting: Obstetrics & Gynecology

## 2017-07-05 ENCOUNTER — Encounter: Payer: Self-pay | Admitting: Certified Nurse Midwife

## 2017-07-05 VITALS — BP 114/70 | HR 68 | Resp 16 | Ht 68.0 in | Wt 128.0 lb

## 2017-07-05 DIAGNOSIS — Z0000 Encounter for general adult medical examination without abnormal findings: Secondary | ICD-10-CM | POA: Diagnosis not present

## 2017-07-05 DIAGNOSIS — Z01419 Encounter for gynecological examination (general) (routine) without abnormal findings: Secondary | ICD-10-CM | POA: Diagnosis not present

## 2017-07-05 DIAGNOSIS — E2839 Other primary ovarian failure: Secondary | ICD-10-CM

## 2017-07-05 DIAGNOSIS — Z124 Encounter for screening for malignant neoplasm of cervix: Secondary | ICD-10-CM

## 2017-07-05 NOTE — Patient Instructions (Signed)

## 2017-07-05 NOTE — Progress Notes (Signed)
56 y.o. G0P0000 Married  Caucasian Fe here for annual exam. Menopausal no HRT. Denies any vaginal bleeding or dryness. No menopausal symptoms now. Sees Urgent care if needed. No visits in past year. Has yearly labs at work and will send copy to review. No other health issues today.   Patient's last menstrual period was 09/30/1997 (approximate).          Sexually active: Yes.    The current method of family planning is status post hysterectomy.    Exercising: Yes.    6 days a week with jogging and body toning, weights Smoker:  no  Health Maintenance: Pap:  06-29-15 neg HPV neg, 07-01-16 neg HPV HR neg History of Abnormal Pap: yes, hx of cervical cancer MMG:  8/18 bilateral & right breast re-evaluation no issues category d density birads 1:neg Self Breast exams: occ Colonoscopy:  2012 BMD:   none TDaP:  2012 Shingles: no Pneumonia: no Hep C and HIV: not done Labs: if needed    reports that she has never smoked. She has never used smokeless tobacco. She reports that she drinks about 2.5 oz of alcohol per week . She reports that she does not use drugs.  Past Medical History:  Diagnosis Date  . Cervical cancer ([place]) 1998   CIS  . Scarring alopecia 04/2016  . Vitamin D deficiency 2008  . Yeast infection    chronic yeast infection     Past Surgical History:  Procedure Laterality Date  . CERVICAL CONE BIOPSY  04/02/1997   Squamous cell caarcinoma in situ of cervix  . COLONOSCOPY  07/2011   normal repeat in 10 years  . COLPOSCOPY  4/04   vain I  . METATARSAL OSTEOTOMY Left 01/2015   repair of Hallux rigidus with Condylectomy  . TOTAL VAGINAL HYSTERECTOMY  10/08/1997   CIS [facility] cervix, [facility]    Current Outpatient Prescriptions  Medication Sig Dispense Refill  . Biotin 10 MG CAPS Take by mouth.    . Calcium Carb-Cholecalciferol (CALCIUM 1000 + D PO) Take by mouth.    . cholecalciferol (VITAMIN D) 1000 UNITS tablet Take 1,000 Units by mouth daily.    . fish oil-omega-3 fatty acids  1000 MG capsule Take 2 g by mouth daily.    . Multiple Vitamin (MULTIVITAMIN) tablet Take 1 tablet by mouth daily.     No current facility-administered medications for this visit.     Family History  Problem Relation Age of Onset  . Hypertension Maternal Aunt   . Pancreatic cancer Maternal Aunt 92  . Thyroid disease Mother   . Stomach cancer Cousin 67  . Cancer Paternal Aunt        ovarian cancer   . Diabetes Maternal Grandmother   . Diabetes Paternal Grandmother   . Heart failure Paternal Grandfather     ROS:  Pertinent items are noted in HPI.  Otherwise, a comprehensive ROS was negative.  Exam:   BP 114/70   Pulse 68   Resp 16   Ht 5\' 8"  (1.727 m)   Wt 128 lb (58.1 kg)   LMP 09/30/1997 (Approximate)   BMI 19.46 kg/m  Height: 5\' 8"  (172.7 cm) Ht Readings from Last 3 Encounters:  07/05/17 5\' 8"  (1.727 m)  07/01/16 5\' 8"  (1.727 m)  06/29/15 5' 8.5" (1.74 m)    General appearance: alert, cooperative and appears stated age Head: Normocephalic, without obvious abnormality, atraumatic Neck: no adenopathy, supple, symmetrical, trachea midline and thyroid normal to inspection and palpation  Lungs: clear to auscultation bilaterally Breasts: normal appearance, no masses or tenderness, No nipple retraction or dimpling, No nipple discharge or bleeding, No axillary or supraclavicular adenopathy Heart: regular rate and rhythm Abdomen: soft, non-tender; no masses,  no organomegaly Extremities: extremities normal, atraumatic, no cyanosis or edema Skin: Skin color, texture, turgor normal. No rashes or lesions Lymph nodes: Cervical, supraclavicular, and axillary nodes normal. No abnormal inguinal nodes palpated Neurologic: Grossly normal   Pelvic: External genitalia:  no lesions              Urethra:  normal appearing urethra with no masses, tenderness or lesions              Bartholin's and Skene's: normal                 Vagina: normal appearing vagina with normal color and  discharge, no lesions              Cervix: absent              Pap taken: Yes.   Bimanual Exam:  Uterus:  uterus absent              Adnexa: normal adnexa and no mass, fullness, tenderness               Rectovaginal: Confirms               Anus:  normal sphincter tone, no lesions  Chaperone present: yes  A:  Well Woman with normal exam  Menopausal no HRT  History of cervical cancer CIS, S/P TVH with ovaries retained  Height loss  Screening labs    P:   Reviewed health and wellness pertinent to exam  Aware of need to evaluate if vaginal bleeding  Discussed need for evaluation due to height loss and menopausal. Patient agreeable. Will schedule prior to leaving today.  Labs HIV, Hep C  Pap smear: yes   counseled on breast self exam, mammography screening, menopause, osteoporosis, adequate intake of calcium and vitamin D, diet and exercise  return annually or prn  An After Visit Summary was printed and given to the patient.

## 2017-07-05 NOTE — Progress Notes (Signed)
Patient scheduled while in office for bone density. Spoke with Anderson Malta at [facility]. Patient scheduled for 08/04/17 arriving at 3:10pm for 3:30pm appointment. Patient declined earlier appointments. Patient aware to stop all calcium supplements 48 hours prior to appointment. Patient verbalizes understanding and is agreeable.

## 2017-07-06 LAB — CYTOLOGY - PAP: Diagnosis: NEGATIVE

## 2017-07-06 LAB — HEPATITIS C ANTIBODY: Hep C Virus Ab: <0.1 s/co ratio (ref 0.0–0.9)

## 2017-07-06 LAB — HIV ANTIBODY (ROUTINE TESTING W REFLEX): HIV SCREEN 4TH GENERATION: NONREACTIVE

## 2017-07-07 ENCOUNTER — Ambulatory Visit: Payer: 59 | Admitting: Nurse Practitioner

## 2017-08-04 ENCOUNTER — Other Ambulatory Visit: Payer: Self-pay

## 2017-09-15 ENCOUNTER — Other Ambulatory Visit: Payer: Self-pay

## 2017-12-15 DIAGNOSIS — K1321 Leukoplakia of oral mucosa, including tongue: Secondary | ICD-10-CM | POA: Diagnosis not present

## 2018-02-15 DIAGNOSIS — L430 Hypertrophic lichen planus: Secondary | ICD-10-CM | POA: Diagnosis not present

## 2018-05-21 ENCOUNTER — Other Ambulatory Visit: Payer: Self-pay | Admitting: Certified Nurse Midwife

## 2018-05-21 ENCOUNTER — Other Ambulatory Visit: Payer: Self-pay | Admitting: Cardiology

## 2018-05-21 DIAGNOSIS — Z1231 Encounter for screening mammogram for malignant neoplasm of breast: Secondary | ICD-10-CM

## 2018-06-14 ENCOUNTER — Ambulatory Visit: Payer: Self-pay

## 2018-07-06 ENCOUNTER — Ambulatory Visit: Payer: BLUE CROSS/BLUE SHIELD | Admitting: Certified Nurse Midwife

## 2018-07-06 ENCOUNTER — Other Ambulatory Visit (HOSPITAL_COMMUNITY)
Admission: RE | Admit: 2018-07-06 | Discharge: 2018-07-06 | Disposition: A | Discharge status: Home / Self Care | Payer: BLUE CROSS/BLUE SHIELD | Source: Ambulatory Visit | Attending: Certified Nurse Midwife | Admitting: Certified Nurse Midwife

## 2018-07-06 ENCOUNTER — Encounter: Payer: Self-pay | Admitting: Certified Nurse Midwife

## 2018-07-06 ENCOUNTER — Other Ambulatory Visit: Payer: Self-pay

## 2018-07-06 VITALS — BP 106/64 | HR 64 | Resp 16 | Ht 68.25 in | Wt 128.0 lb

## 2018-07-06 DIAGNOSIS — Z01419 Encounter for gynecological examination (general) (routine) without abnormal findings: Secondary | ICD-10-CM | POA: Diagnosis not present

## 2018-07-06 DIAGNOSIS — Z124 Encounter for screening for malignant neoplasm of cervix: Secondary | ICD-10-CM

## 2018-07-06 DIAGNOSIS — Z8541 Personal history of malignant neoplasm of cervix uteri: Secondary | ICD-10-CM | POA: Diagnosis not present

## 2018-07-06 NOTE — Progress Notes (Signed)
57 y.o. G0P0000 Married  Caucasian Fe here for annual exam. Menopausal no HRT. Denies vaginal dryness. Patient is thinking about establishing PCP care. Does dermatology check periodic. Recent fall with running, with scrape knees only. Has labs at work, all normal per patient. No health issues today.  Patient's last menstrual period was 09/30/1997 (approximate).          Sexually active: Yes.    The current method of family planning is status post hysterectomy.    Exercising: Yes.    cardio,jogging & yoga Smoker:  no  Review of Systems  Constitutional: Negative.   HENT: Negative.   Eyes: Negative.   Respiratory: Negative.   Cardiovascular: Negative.   Gastrointestinal: Negative.   Genitourinary: Negative.   Musculoskeletal: Negative.   Skin: Negative.   Neurological: Negative.   Endo/Heme/Allergies: Negative.   Psychiatric/Behavioral: Negative.     Health Maintenance: Pap:  07-05-17 neg History of Abnormal Pap: yes cervical cancer MMG:  06-19-17 category density birads 1:neg, scheduled Self Breast exams: yes Colonoscopy:  2012 f/u 1yrs BMD:   none TDaP:  2012 Shingles: not done Pneumonia: not done Hep C and HIV: both neg 2018 Labs: done work   reports that she has never smoked. She has never used smokeless tobacco. She reports that she drinks about 7.0 standard drinks of alcohol per week. She reports that she does not use drugs.  Past Medical History:  Diagnosis Date  . Cervical cancer ([place]) 1998   CIS  . Scarring alopecia 04/2016  . Vitamin D deficiency 2008  . Yeast infection    chronic yeast infection     Past Surgical History:  Procedure Laterality Date  . CERVICAL CONE BIOPSY  04/02/1997   Squamous cell caarcinoma in situ of cervix  . COLONOSCOPY  07/2011   normal repeat in 10 years  . COLPOSCOPY  4/04   vain I  . METATARSAL OSTEOTOMY Left 01/2015   repair of Hallux rigidus with Condylectomy  . TOTAL VAGINAL HYSTERECTOMY  10/08/1997   [facility] cervix, [facility]     Current Outpatient Medications  Medication Sig Dispense Refill  . Biotin 10 MG CAPS Take by mouth.    . Calcium Carb-Cholecalciferol (CALCIUM 1000 + D PO) Take by mouth.    . cholecalciferol (VITAMIN D) 1000 UNITS tablet Take 1,000 Units by mouth daily.    . fish oil-omega-3 fatty acids 1000 MG capsule Take 2 g by mouth daily.    . Multiple Vitamin (MULTIVITAMIN) tablet Take 1 tablet by mouth daily.     No current facility-administered medications for this visit.     Family History  Problem Relation Age of Onset  . Hypertension Maternal Aunt   . Pancreatic cancer Maternal Aunt 92  . Thyroid disease Mother   . Stomach cancer Cousin 37  . Cancer Paternal Aunt        ovarian cancer   . Diabetes Maternal Grandmother   . Diabetes Paternal Grandmother   . Heart failure Paternal Grandfather     ROS:  Pertinent items are noted in HPI.  Otherwise, a comprehensive ROS was negative.  Exam:   BP 106/64   Pulse 64   Resp 16   Ht 5' 8.25" (1.734 m)   Wt 128 lb (58.1 kg)   LMP 09/30/1997 (Approximate)   BMI 19.32 kg/m  Height: 5' 8.25" (173.4 cm) Ht Readings from Last 3 Encounters:  07/06/18 5' 8.25" (1.734 m)  07/05/17 5\' 8"  (1.727 m)  07/01/16 5\' 8"  (1.727  m)    General appearance: alert, cooperative and appears stated age Head: Normocephalic, without obvious abnormality, atraumatic Neck: no adenopathy, supple, symmetrical, trachea midline and thyroid normal to inspection and palpation Lungs: clear to auscultation bilaterally Breasts: normal appearance, no masses or tenderness, No nipple retraction or dimpling, No nipple discharge or bleeding, No axillary or supraclavicular adenopathy Heart: regular rate and rhythm Abdomen: soft, non-tender; no masses,  no organomegaly Extremities: extremities normal, atraumatic, no cyanosis or edema Skin: Skin color, texture, turgor normal. No rashes or lesions Lymph nodes: Cervical, supraclavicular, and axillary nodes normal. No abnormal  inguinal nodes palpated Neurologic: Grossly normal   Pelvic: External genitalia:  no lesions, dryness noted with slight crack in skin              Urethra:  normal appearing urethra with no masses, tenderness or lesions              Bartholin's and Skene's: normal                 Vagina: normal appearing vagina with normal color and discharge, no lesions              Cervix: absent              Pap taken: Yes.   Bimanual Exam:  Uterus:  uterus absent              Adnexa: normal adnexa and no mass, fullness, tenderness               Rectovaginal: Confirms               Anus:  normal sphincter tone, no lesions  Chaperone present: yes  A:  Well Woman with normal exam  Menopausal no HRT, vaginal dryness  History of CIS with TVH with ovaries retained  History of VAIN 1  Mammogram due  P:   Reviewed health and wellness pertinent to exam  Discussed vaginal dryness finding and Olive oil option for use. Instructions given. Area of dryness shown to patient with questions addressed.  Discussed importance of mammogram, patient has scheduled  Pap smear: yes   counseled on breast self exam, mammography screening, feminine hygiene, adequate intake of calcium and vitamin D, diet and exercise  return annually or prn  An After Visit Summary was printed and given to the patient.

## 2018-07-06 NOTE — Patient Instructions (Signed)

## 2018-07-11 LAB — CYTOLOGY - PAP
Diagnosis: NEGATIVE
HPV (WINDOPATH): NOT DETECTED

## 2018-07-13 ENCOUNTER — Ambulatory Visit
Admission: RE | Admit: 2018-07-13 | Discharge: 2018-07-13 | Disposition: A | Discharge status: Home / Self Care | Payer: BLUE CROSS/BLUE SHIELD | Source: Ambulatory Visit | Attending: Certified Nurse Midwife | Admitting: Certified Nurse Midwife

## 2018-07-13 DIAGNOSIS — Z1231 Encounter for screening mammogram for malignant neoplasm of breast: Secondary | ICD-10-CM | POA: Diagnosis not present

## 2018-07-26 DIAGNOSIS — H40003 Preglaucoma, unspecified, bilateral: Secondary | ICD-10-CM | POA: Diagnosis not present

## 2019-06-11 ENCOUNTER — Other Ambulatory Visit: Payer: Self-pay | Admitting: Certified Nurse Midwife

## 2019-06-11 ENCOUNTER — Other Ambulatory Visit: Payer: Self-pay

## 2019-06-11 ENCOUNTER — Ambulatory Visit (INDEPENDENT_AMBULATORY_CARE_PROVIDER_SITE_OTHER): Payer: BC Managed Care – PPO | Admitting: Internal Medicine

## 2019-06-11 ENCOUNTER — Encounter: Payer: Self-pay | Admitting: Internal Medicine

## 2019-06-11 DIAGNOSIS — Z1231 Encounter for screening mammogram for malignant neoplasm of breast: Secondary | ICD-10-CM

## 2019-06-11 DIAGNOSIS — Z0000 Encounter for general adult medical examination without abnormal findings: Secondary | ICD-10-CM | POA: Diagnosis not present

## 2019-06-11 NOTE — Assessment & Plan Note (Signed)
Healthy Flu vaccine in fall Td due 2022 Colon due 2022 Keeps up with mammograms via gyn Stays fit

## 2019-06-11 NOTE — Progress Notes (Signed)
Subjective:    Patient ID: Natasha Mccann, female    DOB: 06/29/1961, 58 y.o.   MRN: [id number]  HPI Virtual visit to establish care Identification done Reviewed billing and she gave consent She is at office and I am in my office  1998 had hysterectomy for cervical cancer She keeps up with gynecologist since then  Jogs 5 days per week and does yoga class 3 days per week   Current Outpatient Medications on File Prior to Visit  Medication Sig Dispense Refill  . Biotin 10 MG CAPS Take by mouth.    . Calcium Carb-Cholecalciferol (CALCIUM 1000 + D PO) Take by mouth.    . COCONUT OIL PO Take by mouth.    . Flaxseed, Linseed, (FLAX SEED OIL PO) Take by mouth.    Marland Kitchen MAGNESIUM PO Take by mouth.    . Multiple Vitamin (MULTIVITAMIN) tablet Take 1 tablet by mouth daily.     No current facility-administered medications on file prior to visit.     Allergies  Allergen Reactions  . Penicillins Nausea Only    Past Medical History:  Diagnosis Date  . Cervical cancer ([place]) 1998   CIS  . Scarring alopecia 04/2016  . Vitamin D deficiency 2008  . Yeast infection    chronic yeast infection     Past Surgical History:  Procedure Laterality Date  . CERVICAL CONE BIOPSY  04/02/1997   Squamous cell caarcinoma in situ of cervix  . COLONOSCOPY  07/2011   normal repeat in 10 years  . COLPOSCOPY  4/04   vain I  . METATARSAL OSTEOTOMY Left 01/2015   repair of Hallux rigidus with Condylectomy  . TOTAL VAGINAL HYSTERECTOMY  10/08/1997   [facility] [place], [facility]    Family History  Problem Relation Age of Onset  . Hypertension Maternal Aunt   . Pancreatic cancer Maternal Aunt 92  . Thyroid disease Mother   . Stomach cancer Cousin 26  . Cancer Paternal Aunt        ovarian cancer   . Diabetes Maternal Grandmother   . Diabetes Paternal Grandmother   . Heart failure Paternal Grandfather     Social History   Socioeconomic History  . Marital status: Married    Spouse name: Not on file   . Number of children: Not on file  . Years of education: Not on file  . Highest education level: Not on file  Occupational History  . Not on file  Social Needs  . Financial resource strain: Not on file  . Food insecurity    Worry: Not on file    Inability: Not on file  . Transportation needs    Medical: Not on file    Non-medical: Not on file  Tobacco Use  . Smoking status: Never Smoker  . Smokeless tobacco: Never Used  Substance and Sexual Activity  . Alcohol use: Yes    Alcohol/week: 7.0 standard drinks    Types: 7 Standard drinks or equivalent per week  . Drug use: No  . Sexual activity: Yes    Birth control/protection: Surgical    Comment: TVH  Lifestyle  . Physical activity    Days per week: Not on file    Minutes per session: Not on file  . Stress: Not on file  Relationships  . Social Herbalist on phone: Not on file    Gets together: Not on file    Attends religious service: Not on file  Active member of club or organization: Not on file    Attends meetings of clubs or organizations: Not on file    Relationship status: Not on file  . Intimate partner violence    Fear of current or ex partner: Not on file    Emotionally abused: Not on file    Physically abused: Not on file    Forced sexual activity: Not on file  Other Topics Concern  . Not on file  Social History Narrative  . Not on file   Review of Systems  Constitutional: Negative for fatigue and unexpected weight change.       Wears seat belt  HENT: Negative for dental problem, hearing loss, tinnitus and trouble swallowing.        Keeps up with dentist  Eyes: Negative for visual disturbance.       No diplopia or unilateral vision losss  Respiratory: Negative for cough, chest tightness and shortness of breath.   Cardiovascular: Negative for chest pain, palpitations and leg swelling.       No change in exercise tolerance  Gastrointestinal: Negative for abdominal pain, blood in stool and  constipation.       No heartburn  Endocrine: Negative for polydipsia and polyuria.  Musculoskeletal: Negative for arthralgias, back pain and joint swelling.  Skin: Negative for rash.       No suspicious lesions Due for derm visit  Allergic/Immunologic: Negative for environmental allergies and immunocompromised state.  Neurological: Negative for dizziness, syncope, light-headedness and headaches.  Hematological: Negative for adenopathy. Does not bruise/bleed easily.  Psychiatric/Behavioral: Negative for dysphoric mood and sleep disturbance. The patient is not nervous/anxious.        Objective:   Physical Exam  Constitutional: She appears well-developed. No distress.  Respiratory: Effort normal. No respiratory distress.  Psychiatric: She has a normal mood and affect. Her behavior is normal.           Assessment & Plan:

## 2019-07-15 ENCOUNTER — Other Ambulatory Visit: Payer: Self-pay

## 2019-07-15 DIAGNOSIS — D1800 Hemangioma unspecified site: Secondary | ICD-10-CM | POA: Diagnosis not present

## 2019-07-15 DIAGNOSIS — L661 Lichen planopilaris: Secondary | ICD-10-CM | POA: Diagnosis not present

## 2019-07-15 DIAGNOSIS — L821 Other seborrheic keratosis: Secondary | ICD-10-CM | POA: Diagnosis not present

## 2019-07-15 DIAGNOSIS — Z1283 Encounter for screening for malignant neoplasm of skin: Secondary | ICD-10-CM | POA: Diagnosis not present

## 2019-07-17 ENCOUNTER — Ambulatory Visit: Payer: BC Managed Care – PPO | Admitting: Certified Nurse Midwife

## 2019-07-17 ENCOUNTER — Encounter: Payer: Self-pay | Admitting: Certified Nurse Midwife

## 2019-07-17 ENCOUNTER — Other Ambulatory Visit: Payer: Self-pay

## 2019-07-17 ENCOUNTER — Other Ambulatory Visit (HOSPITAL_COMMUNITY)
Admission: RE | Admit: 2019-07-17 | Discharge: 2019-07-17 | Disposition: A | Discharge status: Home / Self Care | Payer: BC Managed Care – PPO | Source: Ambulatory Visit | Attending: Certified Nurse Midwife | Admitting: Certified Nurse Midwife

## 2019-07-17 VITALS — BP 100/62 | HR 60 | Temp 97.2°F | Resp 16 | Ht 68.0 in | Wt 130.0 lb

## 2019-07-17 DIAGNOSIS — Z8541 Personal history of malignant neoplasm of cervix uteri: Secondary | ICD-10-CM | POA: Diagnosis not present

## 2019-07-17 DIAGNOSIS — Z01419 Encounter for gynecological examination (general) (routine) without abnormal findings: Secondary | ICD-10-CM | POA: Diagnosis not present

## 2019-07-17 DIAGNOSIS — Z124 Encounter for screening for malignant neoplasm of cervix: Secondary | ICD-10-CM

## 2019-07-17 NOTE — Progress Notes (Signed)
58 y.o. G0P0000 Married  Caucasian Fe here for annual exam. Has established with PCP Dr. Silvio Pate, but no visit. Had lab work with employment recent  all normal. Post menopausal now no symptoms. Denies any vaginal dryness. Jogs for exercise. No health issues today.  Patient's last menstrual period was 09/30/1997 (approximate).          Sexually active: Yes.    The current method of family planning is status post hysterectomy.    Exercising: Yes.    jog, total body toning Smoker:  no  Review of Systems  Constitutional: Negative.   HENT: Negative.   Eyes: Negative.   Respiratory: Negative.   Cardiovascular: Negative.   Gastrointestinal: Negative.   Genitourinary: Negative.   Musculoskeletal: Negative.   Skin: Negative.   Neurological: Negative.   Endo/Heme/Allergies: Negative.   Psychiatric/Behavioral: Negative.     Health Maintenance: Pap:  07-06-2018 neg HPV HR neg History of Abnormal Pap: yes, cervical cancer MMG:  07-13-18 category d density birads 1:neg has scheduled Self Breast exams: occ Colonoscopy:  2012 f/u 73yrs BMD:   none TDaP:  2012 Shingles: not done Pneumonia: not done Hep C and HIV: both neg 2018 Labs: no   reports that she has never smoked. She has never used smokeless tobacco. She reports current alcohol use of about 7.0 standard drinks of alcohol per week. She reports that she does not use drugs.  Past Medical History:  Diagnosis Date  . Cervical cancer ([place]) 1998   CIS  . Scarring alopecia 04/2016  . Vitamin D deficiency 2008  . Yeast infection    chronic yeast infection     Past Surgical History:  Procedure Laterality Date  . CERVICAL CONE BIOPSY  04/02/1997   Squamous cell caarcinoma in situ of cervix  . COLONOSCOPY  07/2011   normal repeat in 10 years  . COLPOSCOPY  4/04   vain I  . METATARSAL OSTEOTOMY Left 01/2015   repair of Hallux rigidus with Condylectomy  . TOTAL VAGINAL HYSTERECTOMY  10/08/1997   [facility], [facility]    Current  Outpatient Medications  Medication Sig Dispense Refill  . Biotin 10 MG CAPS Take by mouth.    . Calcium Carb-Cholecalciferol (CALCIUM 1000 + D PO) Take by mouth.    . COCONUT OIL PO Take by mouth.    . Flaxseed, Linseed, (FLAX SEED OIL PO) Take by mouth.    Marland Kitchen MAGNESIUM PO Take by mouth.    . Multiple Vitamin (MULTIVITAMIN) tablet Take 1 tablet by mouth daily.     No current facility-administered medications for this visit.     Family History  Problem Relation Age of Onset  . Hypertension Maternal Aunt   . Pancreatic cancer Maternal Aunt 92  . Thyroid disease Mother   . Stomach cancer Cousin 93  . Cancer Paternal Aunt        ovarian cancer   . Diabetes Maternal Grandmother   . Diabetes Paternal Grandmother   . Heart failure Paternal Grandfather     ROS:  Pertinent items are noted in HPI.  Otherwise, a comprehensive ROS was negative.  Exam:   LMP 09/30/1997 (Approximate)    Ht Readings from Last 3 Encounters:  07/06/18 5' 8.25" (1.734 m)  07/05/17 5\' 8"  (1.727 m)  07/01/16 5\' 8"  (1.727 m)    General appearance: alert, cooperative and appears stated age Head: Normocephalic, without obvious abnormality, atraumatic Neck: no adenopathy, supple, symmetrical, trachea midline and thyroid normal to inspection and palpation  Lungs: clear to auscultation bilaterally Breasts: normal appearance, no masses or tenderness, No nipple retraction or dimpling, No nipple discharge or bleeding, No axillary or supraclavicular adenopathy Heart: regular rate and rhythm Abdomen: soft, non-tender; no masses,  no organomegaly Extremities: extremities normal, atraumatic, no cyanosis or edema Skin: Skin color, texture, turgor normal. No rashes or lesions Lymph nodes: Cervical, supraclavicular, and axillary nodes normal. No abnormal inguinal nodes palpated Neurologic: Grossly normal   Pelvic: External genitalia:  no lesions              Urethra:  normal appearing urethra with no masses, tenderness  or lesions              Bartholin's and Skene's: normal                 Vagina: normal appearing vagina with normal color and discharge, no lesions              Cervix: absent              Pap taken: No. Bimanual Exam:  Uterus:  uterus absent              Adnexa: normal adnexa and no mass, fullness, tenderness               Rectovaginal: Confirms               Anus:  normal sphincter tone, no lesions  Chaperone present: yes  A:  Well Woman with normal exam  Menopausal no HRT s/pTVH due to CIS  History of VAIN1  PCP management of labs  P:   Reviewed health and wellness pertinent to exam  Aware of need to advise if vaginal dryness issues or unusual skin area in vulva or vaginal opening.  Continue follow up with new PCP  Pap smear: yes   counseled on breast self exam, mammography screening, feminine hygiene,BMD due next year, adequate intake of calcium and vitamin D, diet and exercise  return annually or prn  An After Visit Summary was printed and given to the patient.

## 2019-07-17 NOTE — Patient Instructions (Signed)
EXERCISE AND DIET:  We recommended that you start or continue a regular exercise program for good health. Regular exercise means any activity that makes your heart beat faster and makes you sweat.  We recommend exercising at least 30 minutes per day at least 3 days a week, preferably 4 or 5.  We also recommend a diet low in fat and sugar.  Inactivity, poor dietary choices and obesity can cause diabetes, heart attack, stroke, and kidney damage, among others.    ALCOHOL AND SMOKING:  Women should limit their alcohol intake to no more than 7 drinks/beers/glasses of wine (combined, not each!) per week. Moderation of alcohol intake to this level decreases your risk of breast cancer and liver damage. And of course, no recreational drugs are part of a healthy lifestyle.  And absolutely no smoking or even second hand smoke. Most people know smoking can cause heart and lung diseases, but did you know it also contributes to weakening of your bones? Aging of your skin?  Yellowing of your teeth and nails?  CALCIUM AND VITAMIN D:  Adequate intake of calcium and Vitamin D are recommended.  The recommendations for exact amounts of these supplements seem to change often, but generally speaking 600 mg of calcium (either carbonate or citrate) and 800 units of Vitamin D per day seems prudent. Certain women may benefit from higher intake of Vitamin D.  If you are among these women, your doctor will have told you during your visit.    PAP SMEARS:  Pap smears, to check for cervical cancer or precancers,  have traditionally been done yearly, although recent scientific advances have shown that most women can have pap smears less often.  However, every woman still should have a physical exam from her gynecologist every year. It will include a breast check, inspection of the vulva and vagina to check for abnormal growths or skin changes, a visual exam of the cervix, and then an exam to evaluate the size and shape of the uterus and  ovaries.  And after 59 years of age, a rectal exam is indicated to check for rectal cancers. We will also provide age appropriate advice regarding health maintenance, like when you should have certain vaccines, screening for sexually transmitted diseases, bone density testing, colonoscopy, mammograms, etc.   MAMMOGRAMS:  All women over 40 years old should have a yearly mammogram. Many facilities now offer a "3D" mammogram, which may cost around $50 extra out of pocket. If possible,  we recommend you accept the option to have the 3D mammogram performed.  It both reduces the number of women who will be called back for extra views which then turn out to be normal, and it is better than the routine mammogram at detecting truly abnormal areas.    COLONOSCOPY:  Colonoscopy to screen for colon cancer is recommended for all women at age 50.  We know, you hate the idea of the prep.  We agree, BUT, having colon cancer and not knowing it is worse!!  Colon cancer so often starts as a polyp that can be seen and removed at colonscopy, which can quite literally save your life!  And if your first colonoscopy is normal and you have no family history of colon cancer, most women don't have to have it again for 10 years.  Once every ten years, you can do something that may end up saving your life, right?  We will be happy to help you get it scheduled when you are ready.    Be sure to check your insurance coverage so you understand how much it will cost.  It may be covered as a preventative service at no cost, but you should check your particular policy.      Bone Density Test The bone density test uses a special type of X-ray to measure the amount of calcium and other minerals in your bones. It can measure bone density in the hip and the spine. The test procedure is similar to having a regular X-ray. This test may also be called:  Bone densitometry.  Bone mineral density test.  Dual-energy X-ray absorptiometry (DEXA). You  may have this test to:  Diagnose a condition that causes weak or thin bones (osteoporosis).  Screen you for osteoporosis.  Predict your risk for a broken bone (fracture).  Determine how well your osteoporosis treatment is working. Tell a health care provider about:  Any allergies you have.  All medicines you are taking, including vitamins, herbs, eye drops, creams, and over-the-counter medicines.  Any problems you or family members have had with anesthetic medicines.  Any blood disorders you have.  Any surgeries you have had.  Any medical conditions you have.  Whether you are pregnant or may be pregnant.  Any medical tests you have had within the past 14 days that used contrast material. What are the risks? Generally, this is a safe procedure. However, it does expose you to a small amount of radiation, which can slightly increase your cancer risk. What happens before the procedure?  Do not take any calcium supplements starting 24 hours before your test.  Remove all metal jewelry, eyeglasses, dental appliances, and any other metal objects. What happens during the procedure?   You will lie down on an exam table. There will be an X-ray generator below you and an imaging device above you.  Other devices, such as boxes or braces, may be used to position your body properly for the scan.  The machine will slowly scan your body. You will need to keep still.  The images will show up on a screen in the room. Images will be examined by a specialist after your test is done. The procedure may vary among health care providers and hospitals. What happens after the procedure?  It is up to you to get your test results. Ask your health care provider, or the department that is doing the test, when your results will be ready. Summary  A bone density test is an imaging test that uses a type of X-ray to measure the amount of calcium and other minerals in your bones.  The test may be used  to diagnose or screen you for a condition that causes weak or thin bones (osteoporosis), predict your risk for a broken bone (fracture), or determine how well your osteoporosis treatment is working.  Do not take any calcium supplements starting 24 hours before your test.  Ask your health care provider, or the department that is doing the test, when your results will be ready. This information is not intended to replace advice given to you by your health care provider. Make sure you discuss any questions you have with your health care provider. Document Released: 11/08/2004 Document Revised: 11/02/2017 Document Reviewed: 08/21/2017 Elsevier Patient Education  2020 [employer].

## 2019-07-22 LAB — CYTOLOGY - PAP: Diagnosis: NEGATIVE

## 2019-07-26 ENCOUNTER — Other Ambulatory Visit: Payer: Self-pay

## 2019-07-26 ENCOUNTER — Ambulatory Visit
Admission: RE | Admit: 2019-07-26 | Discharge: 2019-07-26 | Disposition: A | Discharge status: Home / Self Care | Payer: BC Managed Care – PPO | Source: Ambulatory Visit | Attending: Certified Nurse Midwife | Admitting: Certified Nurse Midwife

## 2019-07-26 DIAGNOSIS — Z1231 Encounter for screening mammogram for malignant neoplasm of breast: Secondary | ICD-10-CM | POA: Diagnosis not present

## 2020-01-21 DIAGNOSIS — H40003 Preglaucoma, unspecified, bilateral: Secondary | ICD-10-CM | POA: Diagnosis not present

## 2020-01-22 ENCOUNTER — Encounter: Payer: Self-pay | Admitting: Certified Nurse Midwife

## 2020-02-06 ENCOUNTER — Telehealth: Payer: Self-pay

## 2020-02-06 NOTE — Telephone Encounter (Signed)
Patient called in asking for a referral to [facility] to see Dr. Irish Elders. She attempted to call herself and scheduled but they need a referral from you first. Office notes are in our legacy system. Are you OK with doing this referral?

## 2020-02-10 NOTE — Telephone Encounter (Signed)
Yes, she can be referred to [facility] for hair loss (possible LPP/FFA).  Dr. Irish Elders has a long wait generally, so she may end up seeing another dermatologist, and that is okay if she wants to get in sooner.  Please send last scanned note.

## 2020-02-10 NOTE — Telephone Encounter (Signed)
Advised patient of information per Dr. Nicole Kindred. I will fax referral to Dr. Lavell Luster office.  Fax# [phone]

## 2020-06-11 ENCOUNTER — Encounter: Payer: Self-pay | Admitting: Internal Medicine

## 2020-06-11 ENCOUNTER — Ambulatory Visit (INDEPENDENT_AMBULATORY_CARE_PROVIDER_SITE_OTHER): Payer: BC Managed Care – PPO | Admitting: Internal Medicine

## 2020-06-11 VITALS — BP 102/68 | HR 60 | Temp 97.6°F | Ht 68.0 in | Wt 127.0 lb

## 2020-06-11 DIAGNOSIS — Z0000 Encounter for general adult medical examination without abnormal findings: Secondary | ICD-10-CM

## 2020-06-11 DIAGNOSIS — Z1211 Encounter for screening for malignant neoplasm of colon: Secondary | ICD-10-CM | POA: Diagnosis not present

## 2020-06-11 DIAGNOSIS — Z23 Encounter for immunization: Secondary | ICD-10-CM | POA: Diagnosis not present

## 2020-06-11 NOTE — Assessment & Plan Note (Signed)
Healthy Probably doesn't need paps anymore Yearly mammogram Due for colon Td today Prefers no flu vaccine---but urged her to get COVID

## 2020-06-11 NOTE — Progress Notes (Signed)
Subjective:    Patient ID: Natasha Mccann, female    DOB: October 29, 1961, 59 y.o.   MRN: [id number]  HPI Here for physical This visit occurred during the SARS-CoV-2 public health emergency.  Safety protocols were in place, including screening questions prior to the visit, additional usage of staff PPE, and extensive cleaning of exam room while observing appropriate contact time as indicated for disinfecting solutions.   No new concerns Keeps up with pap smears---but hysterectomy was over 20 years ago Mammogram every year  Current Outpatient Medications on File Prior to Visit  Medication Sig Dispense Refill  . Biotin 10 MG CAPS Take by mouth.    . Calcium Carb-Cholecalciferol (CALCIUM 1000 + D PO) Take by mouth.    . COCONUT OIL PO Take by mouth.    . Flaxseed, Linseed, (FLAX SEED OIL PO) Take by mouth.    Marland Kitchen MAGNESIUM PO Take by mouth.    . Multiple Vitamin (MULTIVITAMIN) tablet Take 1 tablet by mouth daily.     No current facility-administered medications on file prior to visit.    Allergies  Allergen Reactions  . Hydrocodone     Stomach upset    Past Medical History:  Diagnosis Date  . Cervical cancer ([place]) 1998   CIS  . Scarring alopecia 04/2016  . Vitamin D deficiency 2008  . Yeast infection    chronic yeast infection     Past Surgical History:  Procedure Laterality Date  . CERVICAL CONE BIOPSY  04/02/1997   Squamous cell caarcinoma in situ of cervix  . COLONOSCOPY  07/2011   normal repeat in 10 years  . COLPOSCOPY  4/04   vain I  . METATARSAL OSTEOTOMY Left 01/2015   repair of Hallux rigidus with Condylectomy  . TOTAL VAGINAL HYSTERECTOMY  10/08/1997   CIS of cervix, [facility]    Family History  Problem Relation Age of Onset  . Hypertension Maternal Aunt   . Pancreatic cancer Maternal Aunt 92  . Thyroid disease Mother   . Stomach cancer Cousin 62  . Cancer Paternal Aunt        ovarian cancer   . Diabetes Maternal Grandmother   . Diabetes Paternal  Grandmother   . Heart failure Paternal Grandfather     Social History   Socioeconomic History  . Marital status: Married    Spouse name: Not on file  . Number of children: 0  . Years of education: Not on file  . Highest education level: Not on file  Occupational History  . Occupation: Engineer, maintenance (IT)    Comment: [employer]  Tobacco Use  . Smoking status: Never Smoker  . Smokeless tobacco: Never Used  Substance and Sexual Activity  . Alcohol use: Yes    Alcohol/week: 7.0 standard drinks    Types: 7 Standard drinks or equivalent per week  . Drug use: No  . Sexual activity: Yes    Birth control/protection: Surgical    Comment: [facility]  Other Topics Concern  . Not on file  Social History Narrative  . Not on file   Social Determinants of Health   Financial Resource Strain:   . Difficulty of Paying Living Expenses:   Food Insecurity:   . Worried About Charity fundraiser in the Last Year:   . Arboriculturist in the Last Year:   Transportation Needs:   . Film/video editor (Medical):   Marland Kitchen Lack of Transportation (Non-Medical):   Physical Activity:   . Days of Exercise  per Week:   . Minutes of Exercise per Session:   Stress:   . Feeling of Stress :   Social Connections:   . Frequency of Communication with Friends and Family:   . Frequency of Social Gatherings with Friends and Family:   . Attends Religious Services:   . Active Member of Clubs or Organizations:   . Attends Archivist Meetings:   Marland Kitchen Marital Status:   Intimate Partner Violence:   . Fear of Current or Ex-Partner:   . Emotionally Abused:   Marland Kitchen Physically Abused:   . Sexually Abused:       Review of Systems  Constitutional: Negative for fatigue and unexpected weight change.       Works out daly---jogs 3 miles before work. Does yoga/pilates blend Wears seat belt  HENT: Negative for dental problem, hearing loss and tinnitus.        Keeps up with dentist  Eyes: Negative for visual disturbance.       No  diplopia or unilateral vision loss  Respiratory: Negative for cough, chest tightness and shortness of breath.   Cardiovascular: Negative for chest pain, palpitations and leg swelling.  Gastrointestinal: Negative for abdominal pain, blood in stool and constipation.       No heartburn  Endocrine: Negative for polydipsia and polyuria.  Genitourinary: Negative for dyspareunia, dysuria and hematuria.  Musculoskeletal: Negative for arthralgias, back pain and joint swelling.  Skin: Negative for rash.       Keeps up with dermalogist Nicole Kindred)  Allergic/Immunologic: Negative for environmental allergies and immunocompromised state.  Neurological: Negative for dizziness, syncope, light-headedness and headaches.  Hematological: Negative for adenopathy. Does not bruise/bleed easily.  Psychiatric/Behavioral: Negative for dysphoric mood and sleep disturbance. The patient is not nervous/anxious.        Objective:   Physical Exam Constitutional:      Appearance: Normal appearance.  HENT:     Head: Normocephalic and atraumatic.     Right Ear: Tympanic membrane and ear canal normal.     Left Ear: Tympanic membrane and ear canal normal.     Mouth/Throat:     Comments: No lesions Eyes:     Conjunctiva/sclera: Conjunctivae normal.     Pupils: Pupils are equal, round, and reactive to light.  Cardiovascular:     Rate and Rhythm: Normal rate and regular rhythm.     Pulses: Normal pulses.     Heart sounds: No murmur heard.  No gallop.   Pulmonary:     Effort: Pulmonary effort is normal.     Breath sounds: Normal breath sounds. No wheezing or rales.  Abdominal:     Palpations: Abdomen is soft.     Tenderness: There is no abdominal tenderness.  Musculoskeletal:     Cervical back: Neck supple.     Right lower leg: No edema.     Left lower leg: No edema.  Lymphadenopathy:     Cervical: No cervical adenopathy.  Skin:    Findings: No rash.  Neurological:     General: No focal deficit present.      Mental Status: She is alert and oriented to person, place, and time.  Psychiatric:        Mood and Affect: Mood normal.        Behavior: Behavior normal.            Assessment & Plan:

## 2020-06-12 ENCOUNTER — Encounter: Payer: Self-pay | Admitting: Gastroenterology

## 2020-06-12 DIAGNOSIS — Z23 Encounter for immunization: Secondary | ICD-10-CM | POA: Diagnosis not present

## 2020-06-12 DIAGNOSIS — Z0000 Encounter for general adult medical examination without abnormal findings: Secondary | ICD-10-CM | POA: Diagnosis not present

## 2020-06-12 NOTE — Addendum Note (Signed)
Addended by: Elta Guadeloupe on: 06/12/2020 03:53 PM   Modules accepted: Orders

## 2020-07-08 ENCOUNTER — Encounter: Payer: Self-pay | Admitting: Family Medicine

## 2020-07-08 ENCOUNTER — Ambulatory Visit: Payer: BC Managed Care – PPO | Admitting: Family Medicine

## 2020-07-08 ENCOUNTER — Other Ambulatory Visit: Payer: Self-pay

## 2020-07-08 VITALS — BP 116/70 | HR 71 | Temp 97.8°F | Ht 68.0 in | Wt 127.5 lb

## 2020-07-08 DIAGNOSIS — L239 Allergic contact dermatitis, unspecified cause: Secondary | ICD-10-CM | POA: Diagnosis not present

## 2020-07-08 MED ORDER — PREDNISONE 20 MG PO TABS: ORAL_TABLET | ORAL | 0 refills | Status: DC

## 2020-07-08 MED ORDER — DEXAMETHASONE SODIUM PHOSPHATE 100 MG/10ML IJ SOLN
10.0000 mg | Freq: Once | INTRAMUSCULAR | Status: AC
  Administered 2020-07-08: 10 mg via INTRAMUSCULAR

## 2020-07-08 NOTE — Progress Notes (Signed)
    Jaden Batchelder T. Aleah Ahlgrim, MD, [place] at [facility] [place], [address]  Phone: [phone]  FAX: [place] - 59 y.o. female  MRN [id number]  Date of Birth: 1961-09-15  Date: 07/08/2020  PCP: Venia Carbon, MD  Referral: Venia Carbon, MD  Chief Complaint  Patient presents with  . Poison Ivy    x1 wk, legs, lower back, arms,and hips.    This visit occurred during the SARS-CoV-2 public health emergency.  Safety protocols were in place, including screening questions prior to the visit, additional usage of staff PPE, and extensive cleaning of exam room while observing appropriate contact time as indicated for disinfecting solutions.   Subjective:   Natasha Mccann is a 59 y.o. very pleasant female patient with Body mass index is 19.39 kg/m. who presents with the following:  Poison Ivy: The patient has had some allergic dermatitis secondary to plant exposure for 1 week and it has been worsening.  She has some on her arms around the wrist as well as much of her thighs, buttocks, and lower back.  It is quite itchy and it hurts on.  Decadron 10 mg Then pred  Review of Systems is noted in the HPI, as appropriate  Objective:   BP 116/70   Pulse 71   Temp 97.8 F (36.6 C) (Temporal)   Ht 5\' 8"  (1.727 m)   Wt 127 lb 8 oz (57.8 kg)   LMP 09/30/1997 (Approximate)   SpO2 96%   BMI 19.39 kg/m   GEN: No acute distress; alert,appropriate. PULM: Breathing comfortably in no respiratory distress PSYCH: Normally interactive.   Multiple, many vesicular areas with air at various stages of healing throughout distal upper extremities, predominantly the and above thigh as well as lower back and buttocks region.  Laboratory and Imaging Data:  Assessment and Plan:     ICD-10-CM   1. Allergic dermatitis  L23.9    Decadron 10 mg IM in the office.  Precautions  given. Prednisone taper.  Follow-up: No follow-ups on file.  Meds ordered this encounter  Medications  . predniSONE (DELTASONE) 20 MG tablet    Sig: 2 tabs po daily for 5 days, then 1 tab po daily for 5 days    Dispense:  15 tablet    Refill:  0   There are no discontinued medications. No orders of the defined types were placed in this encounter.   Signed,  Maud Deed. Lenvil Swaim, MD   Outpatient Encounter Medications as of 07/08/2020  Medication Sig  . bimatoprost (LATISSE) 0.03 % ophthalmic solution APPLY TO UPPER EYELID LASH LINE AT BEDTIME  . Biotin 10 MG CAPS Take by mouth.  . Calcium Carb-Cholecalciferol (CALCIUM 1000 + D PO) Take by mouth.  . clobetasol (OLUX) 0.05 % topical foam Apply topically at bedtime.  . COCONUT OIL PO Take by mouth.  . Flaxseed, Linseed, (FLAX SEED OIL PO) Take by mouth.  Marland Kitchen MAGNESIUM PO Take by mouth.  . Multiple Vitamin (MULTIVITAMIN) tablet Take 1 tablet by mouth daily.  . predniSONE (DELTASONE) 20 MG tablet 2 tabs po daily for 5 days, then 1 tab po daily for 5 days   No facility-administered encounter medications on file as of 07/08/2020.

## 2020-07-08 NOTE — Addendum Note (Signed)
Addended by: Carter Kitten on: 07/08/2020 03:18 PM   Modules accepted: Orders

## 2020-07-15 DIAGNOSIS — L661 Lichen planopilaris: Secondary | ICD-10-CM | POA: Diagnosis not present

## 2020-07-17 ENCOUNTER — Other Ambulatory Visit: Payer: Self-pay | Admitting: Obstetrics and Gynecology

## 2020-07-17 DIAGNOSIS — Z1231 Encounter for screening mammogram for malignant neoplasm of breast: Secondary | ICD-10-CM

## 2020-07-18 ENCOUNTER — Other Ambulatory Visit: Payer: Self-pay | Admitting: Dermatology

## 2020-07-21 ENCOUNTER — Ambulatory Visit (INDEPENDENT_AMBULATORY_CARE_PROVIDER_SITE_OTHER): Payer: BC Managed Care – PPO | Admitting: Dermatology

## 2020-07-21 ENCOUNTER — Other Ambulatory Visit: Payer: Self-pay

## 2020-07-21 DIAGNOSIS — Z1283 Encounter for screening for malignant neoplasm of skin: Secondary | ICD-10-CM | POA: Diagnosis not present

## 2020-07-21 DIAGNOSIS — L259 Unspecified contact dermatitis, unspecified cause: Secondary | ICD-10-CM | POA: Diagnosis not present

## 2020-07-21 DIAGNOSIS — L578 Other skin changes due to chronic exposure to nonionizing radiation: Secondary | ICD-10-CM

## 2020-07-21 DIAGNOSIS — L661 Lichen planopilaris: Secondary | ICD-10-CM | POA: Diagnosis not present

## 2020-07-21 DIAGNOSIS — D229 Melanocytic nevi, unspecified: Secondary | ICD-10-CM

## 2020-07-21 DIAGNOSIS — L659 Nonscarring hair loss, unspecified: Secondary | ICD-10-CM

## 2020-07-21 DIAGNOSIS — L821 Other seborrheic keratosis: Secondary | ICD-10-CM

## 2020-07-21 DIAGNOSIS — D1800 Hemangioma unspecified site: Secondary | ICD-10-CM

## 2020-07-21 MED ORDER — BIMATOPROST 0.03 % EX SOLN: CUTANEOUS | 12 refills | Status: DC

## 2020-07-21 MED ORDER — TACROLIMUS 0.1 % EX OINT: TOPICAL_OINTMENT | CUTANEOUS | 5 refills | Status: DC

## 2020-07-21 MED ORDER — CLOBETASOL PROPIONATE 0.05 % EX FOAM: CUTANEOUS | 2 refills | Status: DC

## 2020-07-21 MED ORDER — FINASTERIDE 5 MG PO TABS: 5.0000 mg | ORAL_TABLET | Freq: Every day | ORAL | 2 refills | Status: DC

## 2020-07-21 MED ORDER — KETOCONAZOLE 2 % EX SHAM: MEDICATED_SHAMPOO | CUTANEOUS | 5 refills | Status: DC

## 2020-07-21 NOTE — Patient Instructions (Addendum)
Melanoma ABCDEs  Melanoma is the most dangerous type of skin cancer, and is the leading cause of death from skin disease.  You are more likely to develop melanoma if you:  Have light-colored skin, light-colored eyes, or red or blond hair  Spend a lot of time in the sun  Tan regularly, either outdoors or in a tanning bed  Have had blistering sunburns, especially during childhood  Have a close family member who has had a melanoma  Have atypical moles or large birthmarks  Early detection of melanoma is key since treatment is typically straightforward and cure rates are extremely high if we catch it early.   The first sign of melanoma is often a change in a mole or a new dark spot.  The ABCDE system is a way of remembering the signs of melanoma.  A for asymmetry:  The two halves do not match. B for border:  The edges of the growth are irregular. C for color:  A mixture of colors are present instead of an even brown color. D for diameter:  Melanomas are usually (but not always) greater than 57mm - the size of a pencil eraser. E for evolution:  The spot keeps changing in size, shape, and color.  Please check your skin once per month between visits. You can use a small mirror in front and a large mirror behind you to keep an eye on the back side or your body.   If you see any new or changing lesions before your next follow-up, please call to schedule a visit.  Please continue daily skin protection including broad spectrum sunscreen SPF 30+ to sun-exposed areas, reapplying every 2 hours as needed when you're outdoors.   Start finasteride 5mg  taking 1/2 pill daily. #30 2RF Start ketoconazole 2% shampoo massaging into scalp three times weekly letting sit for 5 minutes before washing out.  Continue clobetasol foam to hairline once daily 5 days a week. Avoid applying to face, groin, and axilla. Use as directed. Risk of skin atrophy with long-term use reviewed.  Start tacrolimus ointment to  hairline in the evenings. Continue tretinoin 0.1% cream to affected areas nightly as tolerated.   Topical steroids (such as triamcinolone, fluocinolone, fluocinonide, mometasone, clobetasol, halobetasol, betamethasone, hydrocortisone) can cause thinning and lightening of the skin if they are used for too long in the same area. Your physician has selected the right strength medicine for your problem and area affected on the body. Please use your medication only as directed by your physician to prevent side effects.   Topical retinoid medications like tretinoin/Retin-A, adapalene/Differin, tazarotene/Fabior, and Epiduo/Epiduo Forte can cause dryness and irritation when first started. Only apply a pea-sized amount to the entire affected area. Avoid applying it around the eyes, edges of mouth and creases at the nose. If you experience irritation, use a good moisturizer first and/or apply the medicine less often. If you are doing well with the medicine, you can increase how often you use it until you are applying every night. Be careful with sun protection while using this medication as it can make you sensitive to the sun. This medicine should not be used by pregnant women.   For rash at legs - Start clobetasol foam to affected areas once daily for up to 1 week. Avoid applying to face, groin, and axilla. Use as directed. Risk of skin atrophy with long-term use reviewed.  Start tacrolimus to affected areas once daily.

## 2020-07-21 NOTE — Progress Notes (Signed)
New Patient Visit  Subjective  Natasha Mccann is a 59 y.o. female who presents for the following: Annual Exam (No new or changing spots patient is aware of.).  Patient here today for TBSE. No history of skin cancer. Patient did see primary care 2 weeks ago for a rash at the right inner thigh that she thought was poison ivy. She was given a steroid injection at the office and given 10 days of prednisone taking 2 pills x 5 days and 1 pill for 5 days. Patient feels like it is coming back. Patient also following up for hair loss. She had a video visit with Dr. Irish Elders last week and was diagnosed with FFA. There are notes in patient's chart with treatment suggestions such as plaquenil, finasteride. We have prescribed clobetasol foam for the patient which she is using.   The following portions of the chart were reviewed this encounter and updated as appropriate:      Review of Systems:  No other skin or systemic complaints except as noted in HPI or Assessment and Plan.  Objective  Well appearing patient in no apparent distress; mood and affect are within normal limits.  A full examination was performed including scalp, head, eyes, ears, nose, lips, neck, chest, axillae, abdomen, back, buttocks, bilateral upper extremities, bilateral lower extremities, hands, feet, fingers, toes, fingernails, and toenails. All findings within normal limits unless otherwise noted below.  Objective  Scalp: Follicular erythema on frontal hairline with patches of scarring alopecia at temporal scalp and crown Patches at bilateral occipital scalp of alopecia with lonely hairs.  Small flesh papules bilateral temples, forehead and infra ocular.   Images              Objective  Right Medial Thigh: Erythema at lower back and pink edematous papules R>L medial thigh  Objective  Eyelids: H/o decreased eyelash density BL eyes- improved with Latisse   Assessment & Plan  Frontal fibrosing  alopecia Scalp  Discussed treatment options as recommended by Dr. Irish Elders.  Plaquenil will require baseline eye exam. Patient has field tests done with [facility] once a year for glaucoma.  She will contact them for exam. Pending exam and lab results will start Plaquenil 200mg  PO twice daily.  Check labs, see below Start finasteride 5mg  taking 1/2 pill daily. #30 2RF Start ketoconazole 2% shampoo massaging into scalp three times weekly letting sit for 5 minutes before washing out.  Continue clobetasol foam to hairline as directed once daily 5 days a week. Off weekends Start tacrolimus ointment to hairline as directed in the evenings. Continue tretinoin 0.1% cream to affected areas face nightly as tolerated.   Topical steroids (such as triamcinolone, fluocinolone, fluocinonide, mometasone, clobetasol, halobetasol, betamethasone, hydrocortisone) can cause thinning and lightening of the skin if they are used for too long in the same area. Your physician has selected the right strength medicine for your problem and area affected on the body. Please use your medication only as directed by your physician to prevent side effects.   Topical retinoid medications like tretinoin/Retin-A, adapalene/Differin, tazarotene/Fabior, and Epiduo/Epiduo Forte can cause dryness and irritation when first started. Only apply a pea-sized amount to the entire affected area. Avoid applying it around the eyes, edges of mouth and creases at the nose. If you experience irritation, use a good moisturizer first and/or apply the medicine less often. If you are doing well with the medicine, you can increase how often you use it until you are applying every night.  Be careful with sun protection while using this medication as it can make you sensitive to the sun. This medicine should not be used by pregnant women.    CBC with Differential/Platelets - Scalp  CMP - Scalp  Ferritin - Scalp  Iron and TIBC - Scalp  Vitamin D,  25-hydroxy - Scalp  Thyroid Panel With TSH - Scalp  Testosterone, free - Scalp  Estradiol - Scalp  Ordered Medications: finasteride (PROSCAR) 5 MG tablet ketoconazole (NIZORAL) 2 % shampoo tacrolimus (PROTOPIC) 0.1 % ointment  Reordered Medications clobetasol (OLUX) 0.05 % topical foam  Contact dermatitis, unspecified contact dermatitis type, unspecified trigger Right Medial Thigh  Start clobetasol foam to affected areas once daily for up to 1 week to medial thighs. Avoid applying to face, groin, and axilla. Use as directed. Risk of skin atrophy with long-term use reviewed.  Start tacrolimus to affected areas once daily.  Topical steroids (such as triamcinolone, fluocinolone, fluocinonide, mometasone, clobetasol, halobetasol, betamethasone, hydrocortisone) can cause thinning and lightening of the skin if they are used for too long in the same area. Your physician has selected the right strength medicine for your problem and area affected on the body. Please use your medication only as directed by your physician to prevent side effects.    Hypotrichosis Eyelids  Continue Latisse nightly to lash line upper eyelid Pt has also been applying to eyebrows and has seen improvement  bimatoprost (LATISSE) 0.03 % ophthalmic solution - Eyelids   Seborrheic Keratoses - Stuck-on, waxy, tan-brown papules and plaques  - Discussed benign etiology and prognosis. - Observe - Call for any changes  Melanocytic Nevi - Tan-brown and/or pink-flesh-colored symmetric macules and papules - Benign appearing on exam today - Observation - Call clinic for new or changing moles - Recommend daily use of broad spectrum spf 30+ sunscreen to sun-exposed areas.   Hemangiomas - Red papules - Discussed benign nature - Observe - Call for any changes  Actinic Damage - diffuse scaly erythematous macules with underlying dyspigmentation - Recommend daily broad spectrum sunscreen SPF 30+ to sun-exposed  areas, reapply every 2 hours as needed.  - Call for new or changing lesions.  Skin cancer screening performed today.   Return in about 3 months (around 10/20/2020) for FFA.  Graciella Belton, RMA, am acting as scribe for Brendolyn Patty, MD . Documentation: I have reviewed the above documentation for accuracy and completeness, and I agree with the above.  Brendolyn Patty MD

## 2020-07-23 DIAGNOSIS — L661 Lichen planopilaris: Secondary | ICD-10-CM | POA: Diagnosis not present

## 2020-07-23 DIAGNOSIS — L659 Nonscarring hair loss, unspecified: Secondary | ICD-10-CM | POA: Diagnosis not present

## 2020-07-28 ENCOUNTER — Telehealth: Payer: Self-pay

## 2020-07-28 LAB — CBC WITH DIFFERENTIAL/PLATELET
Basophils Absolute: 0.1 10*3/uL (ref 0.0–0.2)
Basos: 1 %
EOS (ABSOLUTE): 0.1 10*3/uL (ref 0.0–0.4)
Eos: 1 %
Hematocrit: 42.3 % (ref 34.0–46.6)
Hemoglobin: 14.1 g/dL (ref 11.1–15.9)
Immature Grans (Abs): 0 10*3/uL (ref 0.0–0.1)
Immature Granulocytes: 0 %
Lymphocytes Absolute: 1.6 10*3/uL (ref 0.7–3.1)
Lymphs: 28 %
MCH: 29.7 pg (ref 26.6–33.0)
MCHC: 33.3 g/dL (ref 31.5–35.7)
MCV: 89 fL (ref 79–97)
Monocytes Absolute: 0.5 10*3/uL (ref 0.1–0.9)
Monocytes: 8 %
Neutrophils Absolute: 3.6 10*3/uL (ref 1.4–7.0)
Neutrophils: 62 %
Platelets: 296 10*3/uL (ref 150–450)
RBC: 4.74 x10E6/uL (ref 3.77–5.28)
RDW: 12.9 % (ref 11.7–15.4)
WBC: 5.9 10*3/uL (ref 3.4–10.8)

## 2020-07-28 LAB — IRON AND TIBC
Iron Saturation: 24 % (ref 15–55)
Iron: 75 ug/dL (ref 27–159)
Total Iron Binding Capacity: 319 ug/dL (ref 250–450)
UIBC: 244 ug/dL (ref 131–425)

## 2020-07-28 LAB — THYROID PANEL WITH TSH
Free Thyroxine Index: 1.6 (ref 1.2–4.9)
T3 Uptake Ratio: 26 % (ref 24–39)
T4, Total: 6.1 ug/dL (ref 4.5–12.0)
TSH: 2.17 u[IU]/mL (ref 0.450–4.500)

## 2020-07-28 LAB — COMPREHENSIVE METABOLIC PANEL
ALT: 36 IU/L — ABNORMAL HIGH (ref 0–32)
AST: 24 IU/L (ref 0–40)
Albumin/Globulin Ratio: 1.7 (ref 1.2–2.2)
Albumin: 4.4 g/dL (ref 3.8–4.9)
Alkaline Phosphatase: 63 IU/L (ref 44–121)
BUN/Creatinine Ratio: 17 (ref 12–28)
BUN: 14 mg/dL (ref 8–27)
Bilirubin Total: 0.3 mg/dL (ref 0.0–1.2)
CO2: 25 mmol/L (ref 20–29)
Calcium: 9.1 mg/dL (ref 8.7–10.3)
Chloride: 104 mmol/L (ref 96–106)
Creatinine, Ser: 0.82 mg/dL (ref 0.57–1.00)
GFR calc Af Amer: 90 mL/min/{1.73_m2} (ref >59)
GFR calc non Af Amer: 78 mL/min/{1.73_m2} (ref >59)
Globulin, Total: 2.6 g/dL (ref 1.5–4.5)
Glucose: 89 mg/dL (ref 65–99)
Potassium: 4.1 mmol/L (ref 3.5–5.2)
Sodium: 139 mmol/L (ref 134–144)
Total Protein: 7 g/dL (ref 6.0–8.5)

## 2020-07-28 LAB — FERRITIN: Ferritin: 48 ng/mL (ref 15–150)

## 2020-07-28 LAB — TESTOSTERONE, FREE: Testosterone, Free: 0.4 pg/mL (ref 0.0–4.2)

## 2020-07-28 LAB — ESTRADIOL: Estradiol: <5 pg/mL

## 2020-07-28 LAB — VITAMIN D 25 HYDROXY (VIT D DEFICIENCY, FRACTURES): Vit D, 25-Hydroxy: 36.3 ng/mL (ref 30.0–100.0)

## 2020-07-28 NOTE — Telephone Encounter (Signed)
Pt left vm returning call. I called pt and got her voicemail. Advised pt to call back for results.

## 2020-07-28 NOTE — Telephone Encounter (Signed)
-----   Message from Brendolyn Patty, MD sent at 07/28/2020  8:58 AM EDT ----- Labs all normal, except ALT (liver function) is only slightly elevated at 36 and not clinically relevant (upper limit is 32).

## 2020-07-28 NOTE — Telephone Encounter (Signed)
Lft pt msg to call for lab results/sh

## 2020-07-29 ENCOUNTER — Ambulatory Visit
Admission: RE | Admit: 2020-07-29 | Discharge: 2020-07-29 | Disposition: A | Discharge status: Home / Self Care | Payer: BC Managed Care – PPO | Source: Ambulatory Visit | Attending: Obstetrics and Gynecology | Admitting: Obstetrics and Gynecology

## 2020-07-29 ENCOUNTER — Other Ambulatory Visit: Payer: Self-pay

## 2020-07-29 DIAGNOSIS — Z1231 Encounter for screening mammogram for malignant neoplasm of breast: Secondary | ICD-10-CM

## 2020-07-29 NOTE — Telephone Encounter (Signed)
Patient advised.

## 2020-07-29 NOTE — Telephone Encounter (Signed)
Left msg for patient to return my call to let her know per Dr. Marguerite Olea last note that we will start Plaquenil AFTER labs AND appt with [facility].

## 2020-07-29 NOTE — Telephone Encounter (Signed)
Patient advised of lab results. Would you still like her to start the Plaquenil? Also patient has been scheduled at [facility] on 08/24/2020 (it was their first available).

## 2020-08-07 ENCOUNTER — Other Ambulatory Visit: Payer: Self-pay

## 2020-08-07 ENCOUNTER — Ambulatory Visit (AMBULATORY_SURGERY_CENTER): Payer: Self-pay | Admitting: *Deleted

## 2020-08-07 VITALS — Ht 68.0 in | Wt 125.0 lb

## 2020-08-07 DIAGNOSIS — Z1211 Encounter for screening for malignant neoplasm of colon: Secondary | ICD-10-CM

## 2020-08-07 DIAGNOSIS — Z79899 Other long term (current) drug therapy: Secondary | ICD-10-CM | POA: Diagnosis not present

## 2020-08-07 MED ORDER — SUTAB 1479-225-188 MG PO TABS: ORAL_TABLET | ORAL | 0 refills | Status: DC

## 2020-08-07 NOTE — Progress Notes (Signed)
covid test 08-19-20 at 8:00 am  Pt is aware that care partner will wait in the car during procedure; if they feel like they will be too hot or cold to wait in the car; they may wait in the 4 th floor lobby. Patient is aware to bring only one care partner. We want them to wear a mask (we do not have any that we can provide them), practice social distancing, and we will check their temperatures when they get here.  I did remind the patient that their care partner needs to stay in the parking lot the entire time and have a cell phone available, we will call them when the pt is ready for discharge. Patient will wear mask into building.   No trouble with anesthesia, difficulty with intubation or hx/fam hx of malignant hyperthermia per pt   No egg or soy allergy  No home oxygen use   No medications for weight loss taken  emmi information given  Pt denies constipation issues   Sutab code put into RX and paper copy given to pt to show pharmacy

## 2020-08-10 ENCOUNTER — Encounter: Payer: Self-pay | Admitting: Gastroenterology

## 2020-08-13 ENCOUNTER — Telehealth: Payer: Self-pay

## 2020-08-13 NOTE — Telephone Encounter (Signed)
Progress notes from [facility] in chart review under media.

## 2020-08-13 NOTE — Progress Notes (Signed)
59 y.o. G21P0000 Married Caucasian female here for annual exam.    Is a Engineer, maintenance (IT). Jogs 5 days a week and body toning also.   Has not done Covid vaccine.  Thinking about it.  No questions about it.   PCP: Viviana Simpler, MD     Patient's last menstrual period was 09/30/1997 (approximate).           Sexually active: Yes.    The current method of family planning is status post hysterectomy.    Exercising: Yes.    jogs 5 days/week and yoga 3 days/week Smoker:  no  Health Maintenance: Pap: 07-17-19 Neg, 07-06-18 Neg:Neg HR HPV, 07-05-17 Neg History of abnormal Pap:  Yes, Hx of cervical cancer MMG: 08-01-20 3D/Neg/density D/BiRads1 Colonoscopy:  2012 normal;next 10 years--Schedule on 08-21-20 BMD:   n/a  Result  n/a TDaP: 06-12-20 Td Gardasil:   no HIV: Neg 2018 Hep C:Neg 2018 Screening Labs:  Wellness program at work and dermatologist recently.    reports that she has never smoked. She has never used smokeless tobacco. She reports current alcohol use of about 7.0 standard drinks of alcohol per week. She reports that she does not use drugs.  Past Medical History:  Diagnosis Date  . Cervical cancer ([place]) 1998   CIS  . Scarring alopecia 04/2016  . Vitamin D deficiency 2008  . Yeast infection    chronic yeast infection     Past Surgical History:  Procedure Laterality Date  . CERVICAL CONE BIOPSY  04/02/1997   Squamous cell caarcinoma in situ of cervix  . COLONOSCOPY  07/2011   normal repeat in 10 years  . COLPOSCOPY  4/04   vain I  . left arm surgery     1980  . METATARSAL OSTEOTOMY Left 01/2015   repair of Hallux rigidus with Condylectomy  . TOTAL VAGINAL HYSTERECTOMY  10/08/1997   CIS of cervix, [facility]    Current Outpatient Medications  Medication Sig Dispense Refill  . bimatoprost (LATISSE) 0.03 % ophthalmic solution APPLY TO UPPER EYELID LASH LINE AT BEDTIME    . bimatoprost (LATISSE) 0.03 % ophthalmic solution Place one drop on applicator and apply evenly along the skin  of the upper eyelid at base of eyelashes once daily at bedtime; repeat procedure for second eye (use a clean applicator). 3 mL 12  . Biotin 10 MG CAPS Take by mouth.    . Calcium Carb-Cholecalciferol (CALCIUM 1000 + D PO) Take by mouth.    . clobetasol (OLUX) 0.05 % topical foam Apply once daily to scalp 5 days a week. Avoid applying to face, groin, and axilla. Use as directed. Risk of skin atrophy with long-term use reviewed. 50 g 2  . COCONUT OIL PO Take by mouth.    . finasteride (PROSCAR) 5 MG tablet Take 1 tablet (5 mg total) by mouth daily. (Patient taking differently: Take 5 mg by mouth daily. Takes 2.5 mg) 30 tablet 2  . Flaxseed, Linseed, (FLAX SEED OIL PO) Take by mouth.    Marland Kitchen ketoconazole (NIZORAL) 2 % shampoo Massage into scalp and let sit for at least 5 minutes before washing out, use three times weekly. 120 mL 5  . Multiple Vitamin (MULTIVITAMIN) tablet Take 1 tablet by mouth daily.    . Sodium Sulfate-Mag Sulfate-KCl (SUTAB) [phone] MG TABS MANUFACTURER CODES!! BIN: [id number] PCN: CN GROUP: [id number] MEMBER ID: [id number];DSK AS CASH;NO PRIOR AUTHORIZATION 24 tablet 0  . tacrolimus (PROTOPIC) 0.1 % ointment Apply topically to hairline at bedtime. [place]  g 5   No current facility-administered medications for this visit.    Family History  Problem Relation Age of Onset  . Hypertension Maternal Aunt   . Pancreatic cancer Maternal Aunt 92  . Thyroid disease Mother   . Stomach cancer Cousin 50  . Cancer Paternal Aunt        ovarian cancer   . Diabetes Maternal Grandmother   . Diabetes Paternal Grandmother   . Heart failure Paternal Grandfather   . Colon cancer Neg Hx   . Esophageal cancer Neg Hx   . Rectal cancer Neg Hx     Review of Systems  All other systems reviewed and are negative.   Exam:   BP 100/68   Pulse 78   Ht 5' 7.75" (1.721 m)   Wt 127 lb (57.6 kg)   LMP 09/30/1997 (Approximate)   SpO2 98%   BMI 19.45 kg/m     General appearance: alert, cooperative  and appears stated age Head: normocephalic, without obvious abnormality, atraumatic Neck: no adenopathy, supple, symmetrical, trachea midline and thyroid normal to inspection and palpation Lungs: clear to auscultation bilaterally Breasts: normal appearance, no masses or tenderness, No nipple retraction or dimpling, No nipple discharge or bleeding, No axillary adenopathy Heart: regular rate and rhythm Abdomen: soft, non-tender; no masses, no organomegaly Extremities: extremities normal, atraumatic, no cyanosis or edema Skin: skin color, texture, turgor normal. No rashes or lesions Lymph nodes: cervical, supraclavicular, and axillary nodes normal. Neurologic: grossly normal  Pelvic: External genitalia:  no lesions              No abnormal inguinal nodes palpated.              Urethra:  normal appearing urethra with no masses, tenderness or lesions              Bartholins and Skenes: normal                 Vagina: normal appearing vagina with normal color and discharge, no lesions              Cervix: absent              Pap taken: Yes.   Bimanual Exam:  Uterus:  normal size, contour, position, consistency, mobility, non-tender              Adnexa: no mass, fullness, tenderness              Rectal exam: Yes.  .  Confirms.              Anus:  normal sphincter tone, no lesions  Chaperone was present for exam.  Assessment:   Well woman visit with normal exam. Status post TVH for CIS.   Ovaries remain.  Hx VAIN I. Frontal temporal alopecia.  Plan: Mammogram screening discussed. Self breast awareness reviewed. Pap and HR HPV as above. Guidelines for Calcium, Vitamin D, regular exercise program including cardiovascular and weight bearing exercise.   Follow up annually and prn.

## 2020-08-17 ENCOUNTER — Other Ambulatory Visit (HOSPITAL_COMMUNITY)
Admission: RE | Admit: 2020-08-17 | Discharge: 2020-08-17 | Disposition: A | Discharge status: Home / Self Care | Payer: BC Managed Care – PPO | Source: Ambulatory Visit | Attending: Obstetrics and Gynecology | Admitting: Obstetrics and Gynecology

## 2020-08-17 ENCOUNTER — Ambulatory Visit (INDEPENDENT_AMBULATORY_CARE_PROVIDER_SITE_OTHER): Payer: BC Managed Care – PPO | Admitting: Obstetrics and Gynecology

## 2020-08-17 ENCOUNTER — Encounter: Payer: Self-pay | Admitting: Obstetrics and Gynecology

## 2020-08-17 ENCOUNTER — Other Ambulatory Visit: Payer: Self-pay

## 2020-08-17 VITALS — BP 100/68 | HR 78 | Ht 67.75 in | Wt 127.0 lb

## 2020-08-17 DIAGNOSIS — Z01419 Encounter for gynecological examination (general) (routine) without abnormal findings: Secondary | ICD-10-CM | POA: Diagnosis not present

## 2020-08-17 DIAGNOSIS — Z86001 Personal history of in-situ neoplasm of cervix uteri: Secondary | ICD-10-CM | POA: Diagnosis not present

## 2020-08-17 MED ORDER — HYDROXYCHLOROQUINE SULFATE 200 MG PO TABS: 200.0000 mg | ORAL_TABLET | Freq: Two times a day (BID) | ORAL | 3 refills | Status: DC

## 2020-08-17 NOTE — Telephone Encounter (Signed)
Prescription sent in and patient advised of information per Dr. Nicole Kindred.

## 2020-08-17 NOTE — Patient Instructions (Signed)

## 2020-08-17 NOTE — Telephone Encounter (Signed)
Baseline eye exam and labs normal.  Can send in Plaquenil 200 mg PO bid, #60, 3 rfs

## 2020-08-19 ENCOUNTER — Other Ambulatory Visit: Payer: Self-pay | Admitting: Gastroenterology

## 2020-08-19 DIAGNOSIS — Z1159 Encounter for screening for other viral diseases: Secondary | ICD-10-CM | POA: Diagnosis not present

## 2020-08-19 LAB — CYTOLOGY - PAP
Comment: NEGATIVE
Diagnosis: NEGATIVE
High risk HPV: NEGATIVE

## 2020-08-19 LAB — SARS CORONAVIRUS 2 (TAT 6-24 HRS): SARS Coronavirus 2: NEGATIVE

## 2020-08-20 ENCOUNTER — Telehealth: Payer: Self-pay

## 2020-08-20 NOTE — Telephone Encounter (Signed)
Left message to call Erich Kochan, CMA. °

## 2020-08-20 NOTE — Telephone Encounter (Signed)
-----   Message from Nunzio Cobbs, MD sent at 08/19/2020  1:02 PM EDT ----- Please contact patient with results of testing showing normal pap and negative HR HPV.  Recall 12 months due to history of CIS.   I am highlighting this result so you know to contact the patient.

## 2020-08-20 NOTE — Telephone Encounter (Signed)
Patient notified of pap results and next pap due in 12 months.

## 2020-08-21 ENCOUNTER — Ambulatory Visit (AMBULATORY_SURGERY_CENTER): Payer: BC Managed Care – PPO | Admitting: Gastroenterology

## 2020-08-21 ENCOUNTER — Encounter: Payer: Self-pay | Admitting: Gastroenterology

## 2020-08-21 ENCOUNTER — Other Ambulatory Visit: Payer: Self-pay

## 2020-08-21 VITALS — BP 100/57 | HR 52 | Temp 97.1°F | Resp 17 | Ht 65.0 in | Wt 125.0 lb

## 2020-08-21 DIAGNOSIS — Z1211 Encounter for screening for malignant neoplasm of colon: Secondary | ICD-10-CM | POA: Diagnosis not present

## 2020-08-21 MED ORDER — SODIUM CHLORIDE 0.9 % IV SOLN: 500.0000 mL | Freq: Once | INTRAVENOUS | Status: DC

## 2020-08-21 NOTE — Progress Notes (Signed)
PT taken to PACU. Monitors in place. VSS. Report given to RN. 

## 2020-08-21 NOTE — Op Note (Signed)
[facility] Patient Name: Natasha Mccann Procedure Date: 08/21/2020 7:40 AM MRN: [id number] Endoscopist: Thornton Park MD, MD Age: 59 Referring MD:  Date of Birth: 1961-09-11 Gender: Female Account #: [id number] Procedure:                Colonoscopy Indications:              Screening for colorectal malignant neoplasm                           Patient reports normal colonoscopy 10 years ago in                            [place]                           No known family history of colon cancer or polyps Medicines:                Monitored Anesthesia Care Procedure:                Pre-Anesthesia Assessment:                           - Prior to the procedure, a History and Physical                            was performed, and patient medications and                            allergies were reviewed. The patient's tolerance of                            previous anesthesia was also reviewed. The risks                            and benefits of the procedure and the sedation                            options and risks were discussed with the patient.                            All questions were answered, and informed consent                            was obtained. Prior Anticoagulants: The patient has                            taken no previous anticoagulant or antiplatelet                            agents. ASA Grade Assessment: II - A patient with                            mild systemic disease. After reviewing the risks  and benefits, the patient was deemed in                            satisfactory condition to undergo the procedure.                           After obtaining informed consent, the colonoscope                            was passed under direct vision. Throughout the                            procedure, the patient's blood pressure, pulse, and                            oxygen saturations were monitored continuously. The                             Colonoscope was introduced through the anus and                            advanced to the 3 cm into the ileum using the water                            immersion technique. The colonoscopy was performed                            with difficulty due to significant looping in the                            sigmoid colon. Successful completion of the                            procedure was aided by applying abdominal pressure.                            The patient tolerated the procedure well. The                            quality of the bowel preparation was good. The                            terminal ileum, ileocecal valve, appendiceal                            orifice, and rectum were photographed. Scope In: 8:07:26 AM Scope Out: 8:26:14 AM Scope Withdrawal Time: 0 hours 14 minutes 42 seconds  Total Procedure Duration: 0 hours 18 minutes 48 seconds  Findings:                 The perianal and digital rectal examinations were                            normal.  A few small-mouthed diverticula were found in the                            sigmoid colon.                           The exam was otherwise without abnormality on                            direct and retroflexion views. Complications:            No immediate complications. Estimated Blood Loss:     Estimated blood loss: none. Impression:               - Diverticulosis in the sigmoid colon.                           - The examination was otherwise normal on direct                            and retroflexion views.                           - No specimens collected. Recommendation:           - Patient has a contact number available for                            emergencies. The signs and symptoms of potential                            delayed complications were discussed with the                            patient. Return to normal activities tomorrow.                             Written discharge instructions were provided to the                            patient.                           - Follow a high fiber diet. Drink at least 64                            ounces of water daily. Add a daily stool bulking                            agent such as psyllium (an exampled would be                            Metamucil).                           - Continue present medications.                           -  Repeat colonoscopy in 10 years for surveillance,                            earlier with any new symptoms.                           - Emerging evidence supports eating a diet of                            fruits, vegetables, grains, calcium, and yogurt                            while reducing red meat and alcohol may reduce the                            risk of colon cancer.                           - Thank you for allowing me to be involved in your                            colon cancer prevention. Thornton Park MD, MD 08/21/2020 8:31:41 AM This report has been signed electronically.

## 2020-08-21 NOTE — Progress Notes (Signed)
Vitals-SS  Pt's states no medical or surgical changes since previsit or office visit.  

## 2020-08-21 NOTE — Patient Instructions (Signed)
Handouts Provided:  Diverticulosis ? ?YOU HAD AN ENDOSCOPIC PROCEDURE TODAY AT THE [facility]:   Refer to the procedure report that was given to you for any specific questions about what was found during the examination.  If the procedure report does not answer your questions, please call your gastroenterologist to clarify.  If you requested that your care partner not be given the details of your procedure findings, then the procedure report has been included in a sealed envelope for you to review at your convenience later. ? ?YOU SHOULD EXPECT: Some feelings of bloating in the abdomen. Passage of more gas than usual.  Walking can help get rid of the air that was put into your GI tract during the procedure and reduce the bloating. If you had a lower endoscopy (such as a colonoscopy or flexible sigmoidoscopy) you may notice spotting of blood in your stool or on the toilet paper. If you underwent a bowel prep for your procedure, you may not have a normal bowel movement for a few days. ? ?Please Note:  You might notice some irritation and congestion in your nose or some drainage.  This is from the oxygen used during your procedure.  There is no need for concern and it should clear up in a day or so. ? ?SYMPTOMS TO REPORT IMMEDIATELY: ? ?Following lower endoscopy (colonoscopy or flexible sigmoidoscopy): ? Excessive amounts of blood in the stool ? Significant tenderness or worsening of abdominal pains ? Swelling of the abdomen that is new, acute ? Fever of 100?F or higher ? ?For urgent or emergent issues, a gastroenterologist can be reached at any hour by calling [phone]. ?Do not use MyChart messaging for urgent concerns.  ? ? ?DIET:  We do recommend a small meal at first, but then you may proceed to your regular diet.  Drink plenty of fluids but you should avoid alcoholic beverages for 24 hours. ? ?ACTIVITY:  You should plan to take it easy for the rest of today and you should NOT DRIVE or use heavy  machinery until tomorrow (because of the sedation medicines used during the test).   ? ?FOLLOW UP: ?Our staff will call the number listed on your records 48-72 hours following your procedure to check on you and address any questions or concerns that you may have regarding the information given to you following your procedure. If we do not reach you, we will leave a message.  We will attempt to reach you two times.  During this call, we will ask if you have developed any symptoms of COVID 19. If you develop any symptoms (ie: fever, flu-like symptoms, shortness of breath, cough etc.) before then, please call [phone].  If you test positive for Covid 19 in the 2 weeks post procedure, please call and report this information to us.   ? ?If any biopsies were taken you will be contacted by phone or by letter within the next 1-3 weeks.  Please call us at [phone] if you have not heard about the biopsies in 3 weeks.  ? ? ?SIGNATURES/CONFIDENTIALITY: ?You and/or your care partner have signed paperwork which will be entered into your electronic medical record.  These signatures attest to the fact that that the information above on your After Visit Summary has been reviewed and is understood.  Full responsibility of the confidentiality of this discharge information lies with you and/or your care-partner. ? ?

## 2020-08-25 ENCOUNTER — Telehealth: Payer: Self-pay

## 2020-08-25 NOTE — Telephone Encounter (Signed)
°  Follow up Call-  Call back number 08/21/2020  Post procedure Call Back phone  # [phone]  Permission to leave phone message Yes  Some recent data might be hidden     Patient questions:  Do you have a fever, pain , or abdominal swelling? No. Pain Score  0 *  Have you tolerated food without any problems? Yes.    Have you been able to return to your normal activities? Yes.    Do you have any questions about your discharge instructions: Diet   No. Medications  No. Follow up visit  No.  Do you have questions or concerns about your Care? No.  Actions: * If pain score is 4 or above: No action needed, pain <4.  Follow up Call-  Call back number 08/21/2020  Post procedure Call Back phone  # [phone]  Permission to leave phone message Yes  Some recent data might be hidden     Patient questions:  1. Do you have a fever, pain , or abdominal swelling? Have you developed a fever since your procedure? no  2.   Have you had an respiratory symptoms (SOB or cough) since your procedure? no  3.   Have you tested positive for COVID 19 since your procedure no  4.   Have you had any family members/close contacts diagnosed with the COVID 19 since your procedure?  no

## 2020-09-21 ENCOUNTER — Other Ambulatory Visit: Payer: Self-pay

## 2020-09-21 DIAGNOSIS — L700 Acne vulgaris: Secondary | ICD-10-CM

## 2020-09-21 MED ORDER — TRETINOIN 0.1 % EX CREA: TOPICAL_CREAM | Freq: Every evening | CUTANEOUS | 5 refills | Status: AC

## 2020-10-02 ENCOUNTER — Telehealth: Payer: Self-pay | Admitting: Internal Medicine

## 2020-10-02 NOTE — Telephone Encounter (Signed)
She should make sure to not be exposed to any water that the dog might have gotten the infection from (like from swimming). If they have a well, it should be tested. It could have come from the dog being exposed to a rodent (like a mouse). If she gets fever, muscle aches or headache in the next few weeks, it could be leptospirosis and we can decide what the best treatment would be at that time (no action if no symptoms)

## 2020-10-02 NOTE — Telephone Encounter (Signed)
Pt called due to her dog was diagnosed with leptospirosis which is caused by drinking water that has been contaminated and the vet told her that it could be transmitted to humans and wanted to know what to do.   Please advise

## 2020-10-02 NOTE — Telephone Encounter (Signed)
Left message on VM for pt.

## 2020-10-06 ENCOUNTER — Other Ambulatory Visit: Payer: Self-pay

## 2020-10-06 ENCOUNTER — Ambulatory Visit: Payer: BLUE CROSS/BLUE SHIELD | Admitting: Dermatology

## 2020-10-06 DIAGNOSIS — L661 Lichen planopilaris: Secondary | ICD-10-CM

## 2020-10-06 NOTE — Progress Notes (Signed)
   Follow-Up Visit   Subjective  Natasha Mccann is a 59 y.o. female who presents for the following: Follow-up (3 mo f/u for FFA. Pt treating with Plaquenil 200 mg QD, finasteride 2.5 mg QD, Clobetasol foam once a week, tacrolimus ointment to the hairline in the evenings, and tretinoin in the evenings. Pt reports some improvement. ).  Not as much itching.  Gets most itching on top of head.  No side effects noted from medications.  Tolerating oral meds well.    The following portions of the chart were reviewed this encounter and updated as appropriate:     Review of Systems: No other skin or systemic complaints except as noted in HPI or Assessment and Plan.   Objective  Well appearing patient in no apparent distress; mood and affect are within normal limits.  A focused examination was performed including scalp. Relevant physical exam findings are noted in the Assessment and Plan.  Objective  Scalp: perifollicular erythema of frontal hairline-  Fingerlike scarring with hair loss on crown Extending to parietal scalp and occipital scalp Smooth scarred patches BL temporal scalp photos compared- no change  Assessment & Plan  Frontal fibrosing alopecia Scalp  Chronic condition. Stable with extensive scarring alopecia throughout scalp, still some active areas frontal hairline, crown.  Increase Plaquenil to 200mg  PO twice daily. Increase Clobetasol foam to once a day, 3-5 days per week. Apply to hairline.  Continue tretinoin 0.1% cream to affected areas face nightly as tolerated. Continue tacrolimus ointment to hairline as directed in the evenings. Continue ketoconazole 2% shampoo massaging into scalp three times weekly letting sit for 5 minutes before washing out.  Continue finasteride 5mg  taking 1/2 pill daily.    Will order labs. CBC and Hepatic panel.  Pending labs, will send refills of Plaquenil.   F/u with Dr. Irish Elders in 3 months.   finasteride (PROSCAR) 5 MG tablet -  Scalp  ketoconazole (NIZORAL) 2 % shampoo - Scalp  tacrolimus (PROTOPIC) 0.1 % ointment - Scalp  clobetasol (OLUX) 0.05 % topical foam - Scalp  CBC with Differential/Platelets - Scalp  Hepatic Function Panel - Scalp  Return in about 6 months (around 04/06/2021) for FFA.   I, Harriett Sine, CMA, am acting as scribe for Brendolyn Patty, MD.  Documentation: I have reviewed the above documentation for accuracy and completeness, and I agree with the above.  Brendolyn Patty MD

## 2020-10-14 ENCOUNTER — Other Ambulatory Visit: Payer: Self-pay | Admitting: Dermatology

## 2020-10-14 DIAGNOSIS — L661 Lichen planopilaris: Secondary | ICD-10-CM

## 2020-10-21 DIAGNOSIS — L661 Lichen planopilaris: Secondary | ICD-10-CM | POA: Diagnosis not present

## 2020-10-22 LAB — CBC WITH DIFFERENTIAL/PLATELET
Basophils Absolute: 0.1 10*3/uL (ref 0.0–0.2)
Basos: 2 %
EOS (ABSOLUTE): 0.1 10*3/uL (ref 0.0–0.4)
Eos: 3 %
Hematocrit: 42.1 % (ref 34.0–46.6)
Hemoglobin: 13.9 g/dL (ref 11.1–15.9)
Immature Grans (Abs): 0 10*3/uL (ref 0.0–0.1)
Immature Granulocytes: 0 %
Lymphocytes Absolute: 1.2 10*3/uL (ref 0.7–3.1)
Lymphs: 31 %
MCH: 29.3 pg (ref 26.6–33.0)
MCHC: 33 g/dL (ref 31.5–35.7)
MCV: 89 fL (ref 79–97)
Monocytes Absolute: 0.4 10*3/uL (ref 0.1–0.9)
Monocytes: 9 %
Neutrophils Absolute: 2.3 10*3/uL (ref 1.4–7.0)
Neutrophils: 55 %
Platelets: 294 10*3/uL (ref 150–450)
RBC: 4.75 x10E6/uL (ref 3.77–5.28)
RDW: 12.5 % (ref 11.7–15.4)
WBC: 4.1 10*3/uL (ref 3.4–10.8)

## 2020-10-22 LAB — HEPATIC FUNCTION PANEL
ALT: 29 IU/L (ref 0–32)
AST: 34 IU/L (ref 0–40)
Albumin: 4.5 g/dL (ref 3.8–4.9)
Alkaline Phosphatase: 70 IU/L (ref 44–121)
Bilirubin Total: 0.4 mg/dL (ref 0.0–1.2)
Bilirubin, Direct: 0.11 mg/dL (ref 0.00–0.40)
Total Protein: 7 g/dL (ref 6.0–8.5)

## 2020-10-29 ENCOUNTER — Other Ambulatory Visit: Payer: Self-pay

## 2020-10-29 DIAGNOSIS — L661 Lichen planopilaris: Secondary | ICD-10-CM

## 2020-10-29 MED ORDER — HYDROXYCHLOROQUINE SULFATE 200 MG PO TABS: 200.0000 mg | ORAL_TABLET | Freq: Two times a day (BID) | ORAL | 0 refills | Status: DC

## 2020-10-29 MED ORDER — FINASTERIDE 5 MG PO TABS: 5.0000 mg | ORAL_TABLET | Freq: Every day | ORAL | 0 refills | Status: DC

## 2020-10-29 NOTE — Progress Notes (Signed)
90 supply to [facility]

## 2020-11-04 ENCOUNTER — Telehealth: Payer: Self-pay

## 2020-11-04 NOTE — Telephone Encounter (Signed)
-----   Message from Willeen Niece, MD sent at 11/03/2020 10:57 AM EST ----- CBC/diff, LFTs are normal, continue Plaquenil  200 mg PO bid

## 2020-11-04 NOTE — Telephone Encounter (Signed)
Left pt msg to call for lab results.  Plaquenil refill were sent in 10/29/20./sh

## 2020-11-05 ENCOUNTER — Telehealth: Payer: Self-pay

## 2020-11-05 NOTE — Telephone Encounter (Signed)
-----   Message from Tara Stewart, MD sent at 11/03/2020 10:57 AM EST ----- CBC/diff, LFTs are normal, continue Plaquenil  200 mg PO bid 

## 2020-11-05 NOTE — Telephone Encounter (Signed)
Patient advised of lab results. Confirmed patient has enough medication at this time.

## 2020-12-17 ENCOUNTER — Other Ambulatory Visit: Payer: Self-pay | Admitting: Dermatology

## 2020-12-17 DIAGNOSIS — L661 Lichen planopilaris: Secondary | ICD-10-CM

## 2021-01-27 DIAGNOSIS — L661 Lichen planopilaris: Secondary | ICD-10-CM | POA: Diagnosis not present

## 2021-01-30 ENCOUNTER — Other Ambulatory Visit: Payer: Self-pay | Admitting: Dermatology

## 2021-01-30 DIAGNOSIS — L661 Lichen planopilaris: Secondary | ICD-10-CM

## 2021-02-01 ENCOUNTER — Other Ambulatory Visit: Payer: Self-pay | Admitting: Dermatology

## 2021-02-07 ENCOUNTER — Other Ambulatory Visit: Payer: Self-pay | Admitting: Dermatology

## 2021-02-07 DIAGNOSIS — L661 Lichen planopilaris: Secondary | ICD-10-CM

## 2021-04-14 ENCOUNTER — Other Ambulatory Visit: Payer: Self-pay | Admitting: Dermatology

## 2021-04-14 DIAGNOSIS — L661 Lichen planopilaris: Secondary | ICD-10-CM

## 2021-04-16 ENCOUNTER — Other Ambulatory Visit: Payer: Self-pay | Admitting: Dermatology

## 2021-04-16 DIAGNOSIS — L661 Lichen planopilaris: Secondary | ICD-10-CM

## 2021-04-20 ENCOUNTER — Other Ambulatory Visit: Payer: Self-pay

## 2021-04-20 ENCOUNTER — Ambulatory Visit: Payer: BC Managed Care – PPO | Admitting: Dermatology

## 2021-04-20 DIAGNOSIS — L668 Other cicatricial alopecia: Secondary | ICD-10-CM | POA: Diagnosis not present

## 2021-04-20 DIAGNOSIS — L709 Acne, unspecified: Secondary | ICD-10-CM | POA: Diagnosis not present

## 2021-04-20 DIAGNOSIS — L661 Lichen planopilaris: Secondary | ICD-10-CM

## 2021-04-20 MED ORDER — CLOBETASOL PROPIONATE 0.05 % EX FOAM: CUTANEOUS | 2 refills | Status: AC

## 2021-04-20 MED ORDER — TACROLIMUS 0.1 % EX OINT: TOPICAL_OINTMENT | CUTANEOUS | 5 refills | Status: DC

## 2021-04-20 MED ORDER — KETOCONAZOLE 2 % EX SHAM
MEDICATED_SHAMPOO | CUTANEOUS | 5 refills | Status: DC
Start: 2021-04-20 — End: 2024-06-24

## 2021-04-20 MED ORDER — FINASTERIDE 5 MG PO TABS: 5.0000 mg | ORAL_TABLET | Freq: Every day | ORAL | 0 refills | Status: DC

## 2021-04-20 MED ORDER — TAZAROTENE 0.1 % EX CREA: TOPICAL_CREAM | Freq: Every day | CUTANEOUS | 3 refills | Status: DC

## 2021-04-20 NOTE — Progress Notes (Signed)
Follow-Up Visit   Subjective  Natasha Mccann is a 60 y.o. female who presents for the following: 6 month follow up (Patient here for follow up on frontal fibrosing alopecia. She states she feels things are doing better. She continue to  use finasteride, clobetasol foam, tacrolimus ointiment, plaquenil 200 mg , and tretinoin cream at face. She recently seen dermatologist at [facility] and provider had some suggestions she would like to discuss with Dr. Nicole Kindred today. ).    The following portions of the chart were reviewed this encounter and updated as appropriate:       Objective  Well appearing patient in no apparent distress; mood and affect are within normal limits.  A focused examination was performed including scalp and face. Relevant physical exam findings are noted in the Assessment and Plan.  Scalp Diffuse scaring alopecia on temple and crown with fingerlike projections, Prominent perifollicular erythema on frontal scalp hairline with frontal/temporal hairline recession               face Multiple small flesh papules forehead and temples with closed comedones and sebaceous hyperplasia   Assessment & Plan  Frontal fibrosing alopecia Scalp  Chronic condition. Stable with extensive scarring alopecia throughout scalp, still some active areas frontal hairline, crown.  Patient had last eye exam in March 2022  Pending lab results will send refills and continue Plaquenil 200 by mouth twice daily   Continue finasteride 5 mg, take 1/2 tab (2.5 mg) daily. Pt will discuss with Dr. Irish Elders if she thinks she should increase to 5 mg daily.  Continue Tacrolimus 0.1 % ointment 60 g 5 rf apply to affected areas of scalp at frontal/temporal hairline qhs   Continue ketoconazole 2 % shampoo 2-3x/week  Decrease Clobetasol 0.05 % topical foam to apply to aa's scalp as 3 - 5 days weekly.  Pt likes foam vehicle and wants to continue clobetasol, so will use less frequently since high  potency steroid   Recommend minoxidil 5% (Rogaine for men) solution or foam to be applied to the scalp and left in. This should ideally be used twice daily for best results but it helps with hair regrowth when used at least three times per week. Rogaine initially can cause increased hair shedding for the first few weeks but this will stop with continued use. In studies, people who used minoxidil (Rogaine) for at least 6 months had thicker hair than people who did not. Minoxidil topical (Rogaine) only works as long as it continues to be used. If if it is no longer used then the hair it has been helping to regrow can fall out. Minoxidil topical (Rogaine) can cause increased facial hair growth.   Instructed to avoid sunscreen containing moisturizers that you would be using daily. When using sunscreens try the physical blockers and not chemical blockers.  Photos taken today   Start ILK today to help with inflammation around frontal hairline for will repeat x 8 weeks for total 3 treatments and assess if helping   CBC with Differential/Platelets - Scalp  CMP - Scalp  Intralesional injection - Scalp Location: frontal hairline   Informed Consent: Discussed risks (infection, pain, bleeding, bruising, thinning of the skin, loss of skin pigment, lack of resolution, and recurrence of lesion) and benefits of the procedure, as well as the alternatives. Informed consent was obtained. Preparation: The area was prepared a standard fashion.  Anesthesia:n/a  Procedure Details: An intralesional injection was performed with Kenalog 5 mg/cc.  1.5 cc in  total were injected.  Total number of injections: > 7  Plan: The patient was instructed on post-op care. Recommend OTC analgesia as needed for pain.    tacrolimus (PROTOPIC) 0.1 % ointment - Scalp APPLY TOPICALLY TO HAIRLINE AT BEDTIME.  ketoconazole (NIZORAL) 2 % shampoo - Scalp MASSAGE INTO SCALP AND LET SIT FOR AT LEAST 5 MINUTES BEFORE WASHING OUT,  USE THREE TIMES WEEKLY.  tazarotene (TAZORAC) 0.1 % cream - Scalp Apply topically at bedtime.  finasteride (PROSCAR) 5 MG tablet - Scalp Take 1 tablet (5 mg total) by mouth daily.  clobetasol (OLUX) 0.05 % topical foam - Scalp Apply topically as directed. Qd up to 5 days a week to aa scalp, avoid face, groin, axilla  Acne, unspecified acne type face  Associated with FFA  Patient has tried Retin A 0.1% cream and has not improved.   Discussed isotretinoin - patient deferred   Start tazarotene 0.1 % cream apply to face topically at bedtime as tolerated  Related Medications tazarotene (TAZORAC) 0.1 % cream Apply topically at bedtime.  Return for 8 weeks for ILK injections for ffa. I, Ruthell Rummage, CMA, am acting as scribe for Brendolyn Patty, MD.  Documentation: I have reviewed the above documentation for accuracy and completeness, and I agree with the above.  Brendolyn Patty MD

## 2021-04-20 NOTE — Patient Instructions (Addendum)
Topical steroids (such as triamcinolone, fluocinolone, fluocinonide, mometasone, clobetasol, halobetasol, betamethasone, hydrocortisone) can cause thinning and lightening of the skin if they are used for too long in the same area. Your physician has selected the right strength medicine for your problem and area affected on the body. Please use your medication only as directed by your physician to prevent side effects.    Recommend minoxidil 5% (Rogaine for men) solution or foam to be applied to the scalp and left in. This should ideally be used twice daily for best results but it helps with hair regrowth when used at least three times per week. Rogaine initially can cause increased hair shedding for the first few weeks but this will stop with continued use. In studies, people who used minoxidil (Rogaine) for at least 6 months had thicker hair than people who did not. Minoxidil topical (Rogaine) only works as long as it continues to be used. If if it is no longer used then the hair it has been helping to regrow can fall out. Minoxidil topical (Rogaine) can cause increased facial hair growth which can usually be managed easily with a battery-operated hair trimmer. If facial hair growth is bothersome, switching to the 2% women's version can decrease the risk of unwanted facial hair growth.  If you have any questions or concerns for your doctor, please call our main line at [phone] and press option 4 to reach your doctor's medical assistant. If no one answers, please leave a voicemail as directed and we will return your call as soon as possible. Messages left after 4 pm will be answered the following business day.   You may also send [place] a message via [place]. We typically respond to MyChart messages within 1-2 business days.  For prescription refills, please ask your pharmacy to contact our office. Our fax number is [phone].  If you have an urgent issue when the clinic is closed that cannot wait  until the next business day, you can page your doctor at the number below.    Please note that while we do our best to be available for urgent issues outside of office hours, we are not available 24/7.   If you have an urgent issue and are unable to reach [place], you may choose to seek medical care at your doctor's office, retail clinic, urgent care center, or emergency room.  If you have a medical emergency, please immediately call 911 or go to the emergency department.  Pager Numbers  - Dr. Nehemiah Massed: [phone]  - Dr. Laurence Ferrari: [phone]  - Dr. Nicole Kindred: [phone]  In the event of inclement weather, please call our main line at [phone] for an update on the status of any delays or closures.  Dermatology Medication Tips: Please keep the boxes that topical medications come in in order to help keep track of the instructions about where and how to use these. Pharmacies typically print the medication instructions only on the boxes and not directly on the medication tubes.   If your medication is too expensive, please contact our office at [phone] option 4 or send [place] a message through [place].   We are unable to tell what your co-pay for medications will be in advance as this is different depending on your insurance coverage. However, we may be able to find a substitute medication at lower cost or fill out paperwork to get insurance to cover a needed medication.   If a prior authorization is required to get your medication covered by your  insurance company, please allow [place] 1-2 business days to complete this process.  Drug prices often vary depending on where the prescription is filled and some pharmacies may offer cheaper prices.  The website www.goodrx.com contains coupons for medications through different pharmacies. The prices here do not account for what the cost may be with help from insurance (it may be cheaper with your insurance), but the website can give you the price if you  did not use any insurance.  - You can print the associated coupon and take it with your prescription to the pharmacy.  - You may also stop by our office during regular business hours and pick up a GoodRx coupon card.  - If you need your prescription sent electronically to a different pharmacy, notify our office through [facility] or by phone at [phone] option 4.

## 2021-05-14 DIAGNOSIS — L661 Lichen planopilaris: Secondary | ICD-10-CM | POA: Diagnosis not present

## 2021-05-15 LAB — CBC WITH DIFFERENTIAL/PLATELET
Basophils Absolute: 0.1 10*3/uL (ref 0.0–0.2)
Basos: 1 %
EOS (ABSOLUTE): 0 10*3/uL (ref 0.0–0.4)
Eos: 1 %
Hematocrit: 40.3 % (ref 34.0–46.6)
Hemoglobin: 13.8 g/dL (ref 11.1–15.9)
Immature Grans (Abs): 0 10*3/uL (ref 0.0–0.1)
Immature Granulocytes: 0 %
Lymphocytes Absolute: 1.6 10*3/uL (ref 0.7–3.1)
Lymphs: 35 %
MCH: 30.9 pg (ref 26.6–33.0)
MCHC: 34.2 g/dL (ref 31.5–35.7)
MCV: 90 fL (ref 79–97)
Monocytes Absolute: 0.5 10*3/uL (ref 0.1–0.9)
Monocytes: 10 %
Neutrophils Absolute: 2.5 10*3/uL (ref 1.4–7.0)
Neutrophils: 53 %
Platelets: 255 10*3/uL (ref 150–450)
RBC: 4.46 x10E6/uL (ref 3.77–5.28)
RDW: 12.7 % (ref 11.7–15.4)
WBC: 4.7 10*3/uL (ref 3.4–10.8)

## 2021-05-15 LAB — COMPREHENSIVE METABOLIC PANEL
ALT: 23 IU/L (ref 0–32)
AST: 38 IU/L (ref 0–40)
Albumin/Globulin Ratio: 2 (ref 1.2–2.2)
Albumin: 4.5 g/dL (ref 3.8–4.8)
Alkaline Phosphatase: 56 IU/L (ref 44–121)
BUN/Creatinine Ratio: 12 (ref 12–28)
BUN: 10 mg/dL (ref 8–27)
Bilirubin Total: 0.3 mg/dL (ref 0.0–1.2)
CO2: 25 mmol/L (ref 20–29)
Calcium: 9.8 mg/dL (ref 8.7–10.3)
Chloride: 101 mmol/L (ref 96–106)
Creatinine, Ser: 0.86 mg/dL (ref 0.57–1.00)
Globulin, Total: 2.2 g/dL (ref 1.5–4.5)
Glucose: 79 mg/dL (ref 65–99)
Potassium: 4.2 mmol/L (ref 3.5–5.2)
Sodium: 142 mmol/L (ref 134–144)
Total Protein: 6.7 g/dL (ref 6.0–8.5)
eGFR: 77 mL/min/{1.73_m2} (ref >59)

## 2021-05-17 ENCOUNTER — Telehealth: Payer: Self-pay

## 2021-05-17 MED ORDER — HYDROXYCHLOROQUINE SULFATE 200 MG PO TABS: 200.0000 mg | ORAL_TABLET | Freq: Two times a day (BID) | ORAL | 1 refills | Status: DC

## 2021-05-17 NOTE — Telephone Encounter (Signed)
Patient advised labs okay and RX was sent in.

## 2021-05-17 NOTE — Telephone Encounter (Signed)
Left pt msg to call for lab results.  Have sent in Plaquenil 200mg  1 po bid #[id number] 1 [facility]sh

## 2021-05-17 NOTE — Telephone Encounter (Signed)
-----   Message from Brendolyn Patty, MD sent at 05/17/2021 10:56 AM EDT ----- Labs normal, can send in 5 rfs Plaquenil- please call patient

## 2021-06-17 ENCOUNTER — Encounter: Payer: Self-pay | Admitting: Internal Medicine

## 2021-06-17 ENCOUNTER — Other Ambulatory Visit: Payer: Self-pay

## 2021-06-17 ENCOUNTER — Ambulatory Visit (INDEPENDENT_AMBULATORY_CARE_PROVIDER_SITE_OTHER): Payer: BC Managed Care – PPO | Admitting: Internal Medicine

## 2021-06-17 DIAGNOSIS — L669 Cicatricial alopecia, unspecified: Secondary | ICD-10-CM | POA: Diagnosis not present

## 2021-06-17 DIAGNOSIS — Z0000 Encounter for general adult medical examination without abnormal findings: Secondary | ICD-10-CM

## 2021-06-17 NOTE — Progress Notes (Signed)
Subjective:    Patient ID: Natasha Mccann, female    DOB: [birth year], 60 y.o.   MRN: [id number]  HPI Here for physical This visit occurred during the SARS-CoV-2 public health emergency.  Safety protocols were in place, including screening questions prior to the visit, additional usage of staff PPE, and extensive cleaning of exam room while observing appropriate contact time as indicated for disinfecting solutions.   Still on plaquenil ---for scarring alopecia Not sure if it is helping Did have recent blood work Thinks castor oil has helped  Current Outpatient Medications on File Prior to Visit  Medication Sig Dispense Refill   bimatoprost (LATISSE) 0.03 % ophthalmic solution Place one drop on applicator and apply evenly along the skin of the upper eyelid at base of eyelashes once daily at bedtime; repeat procedure for second eye (use a clean applicator). 3 mL 12   Biotin 10 MG CAPS Take by mouth.     Calcium Carb-Cholecalciferol (CALCIUM 1000 + D PO) Take by mouth.     clobetasol (OLUX) 0.05 % topical foam Apply topically as directed. Qd up to 5 days a week to aa scalp, avoid face, groin, axilla 50 g 2   COCONUT OIL PO Take by mouth.     Flaxseed, Linseed, (FLAX SEED OIL PO) Take by mouth.     hydroxychloroquine (PLAQUENIL) 200 MG tablet Take 1 tablet (200 mg total) by mouth 2 (two) times daily. 180 tablet 1   ketoconazole (NIZORAL) 2 % shampoo MASSAGE INTO SCALP AND LET SIT FOR AT LEAST 5 MINUTES BEFORE WASHING OUT, USE THREE TIMES WEEKLY. 120 mL 5   Multiple Vitamin (MULTIVITAMIN) tablet Take 1 tablet by mouth daily.     tacrolimus (PROTOPIC) 0.1 % ointment APPLY TOPICALLY TO HAIRLINE AT BEDTIME. 60 g 5   tazarotene (TAZORAC) 0.1 % cream Apply topically at bedtime. 60 g 3   tretinoin (RETIN-A) 0.1 % cream Apply topically at bedtime. 45 g 5   No current facility-administered medications on file prior to visit.    Allergies  Allergen Reactions   Hydrocodone     Stomach upset     Past Medical History:  Diagnosis Date   Cervical cancer ([place]) 1998   CIS   Scarring alopecia 04/2016   Vitamin D deficiency 2008   Yeast infection    chronic yeast infection     Past Surgical History:  Procedure Laterality Date   CERVICAL CONE BIOPSY  04/02/1997   Squamous cell caarcinoma in situ of cervix   COLONOSCOPY  07/2011   normal repeat in 10 years   COLPOSCOPY  4/04   vain I   left arm surgery     1980   METATARSAL OSTEOTOMY Left 01/2015   repair of Hallux rigidus with Condylectomy   TOTAL VAGINAL HYSTERECTOMY  10/08/1997   [place], [place] [facility]    Family History  Problem Relation Age of Onset   Hypertension Maternal Aunt    Pancreatic cancer Maternal Aunt 92   Thyroid disease Mother    Stomach cancer Cousin 24   Cancer Paternal Aunt        ovarian cancer    Diabetes Maternal Grandmother    Diabetes Paternal Grandmother    Heart failure Paternal Grandfather    Colon cancer Neg Hx    Esophageal cancer Neg Hx    Rectal cancer Neg Hx     Social History   Socioeconomic History   Marital status: Married    Spouse name: Not  on file   Number of children: 0   Years of education: Not on file   Highest education level: Not on file  Occupational History   Occupation: Engineer, maintenance (IT)    Comment: [employer]  Tobacco Use   Smoking status: Never   Smokeless tobacco: Never  Vaping Use   Vaping Use: Never used  Substance and Sexual Activity   Alcohol use: Yes    Alcohol/week: 7.0 standard drinks    Types: 7 Standard drinks or equivalent per week   Drug use: No   Sexual activity: Yes    Birth control/protection: Surgical    Comment: TVH  Other Topics Concern   Not on file  Social History Narrative   Not on file   Social Determinants of Health   Financial Resource Strain: Not on file  Food Insecurity: Not on file  Transportation Needs: Not on file  Physical Activity: Not on file  Stress: Not on file  Social Connections: Not on file  Intimate Partner  Violence: Not on file   Review of Systems  Constitutional:  Negative for fatigue and unexpected weight change.       Still jogs and does body toning Wears seat belt  HENT:  Negative for dental problem, hearing loss and tinnitus.        Recent crown  Eyes:  Negative for visual disturbance.       No diplopia or unilateral vision loss  Respiratory:  Negative for cough, chest tightness and shortness of breath.   Cardiovascular:  Negative for chest pain, palpitations and leg swelling.  Gastrointestinal:  Negative for blood in stool and constipation.       No heartburn  Endocrine: Negative for polydipsia and polyuria.  Genitourinary:  Negative for dyspareunia, dysuria and hematuria.  Musculoskeletal:  Negative for arthralgias, back pain and joint swelling.  Skin:  Negative for rash.  Allergic/Immunologic: Negative for environmental allergies and immunocompromised state.  Neurological:  Negative for dizziness, syncope, light-headedness and headaches.  Hematological:  Negative for adenopathy. Does not bruise/bleed easily.  Psychiatric/Behavioral:  Negative for dysphoric mood and sleep disturbance. The patient is not nervous/anxious.       Objective:   Physical Exam Constitutional:      Appearance: Normal appearance.  HENT:     Right Ear: Tympanic membrane and ear canal normal.     Left Ear: Tympanic membrane and ear canal normal.     Mouth/Throat:     Pharynx: No oropharyngeal exudate or posterior oropharyngeal erythema.  Eyes:     Conjunctiva/sclera: Conjunctivae normal.     Pupils: Pupils are equal, round, and reactive to light.  Cardiovascular:     Rate and Rhythm: Normal rate and regular rhythm.     Pulses: Normal pulses.     Heart sounds: No murmur heard.   No gallop.  Pulmonary:     Effort: Pulmonary effort is normal.     Breath sounds: Normal breath sounds. No wheezing or rales.  Abdominal:     Palpations: Abdomen is soft.     Tenderness: There is no abdominal tenderness.   Musculoskeletal:     Cervical back: Neck supple.     Right lower leg: No edema.     Left lower leg: No edema.  Lymphadenopathy:     Cervical: No cervical adenopathy.  Skin:    General: Skin is warm.     Findings: No rash.  Neurological:     General: No focal deficit present.     Mental Status: She  is alert and oriented to person, place, and time.  Psychiatric:        Mood and Affect: Mood normal.        Behavior: Behavior normal.           Assessment & Plan:

## 2021-06-17 NOTE — Assessment & Plan Note (Signed)
Healthy Colon due again 2031 Yearly mammogram Still gets yearly paps due to past cervical cancer Recommended bivalent COVID vaccine She is reluctant about flu and shingrix vaccines--recommended them Exercises regularly

## 2021-06-17 NOTE — Assessment & Plan Note (Signed)
Using plaquenil for this

## 2021-06-21 ENCOUNTER — Ambulatory Visit: Payer: BC Managed Care – PPO | Admitting: Dermatology

## 2021-06-21 ENCOUNTER — Other Ambulatory Visit: Payer: Self-pay | Admitting: Obstetrics and Gynecology

## 2021-06-21 DIAGNOSIS — Z1231 Encounter for screening mammogram for malignant neoplasm of breast: Secondary | ICD-10-CM

## 2021-07-28 DIAGNOSIS — L661 Lichen planopilaris: Secondary | ICD-10-CM | POA: Diagnosis not present

## 2021-07-30 ENCOUNTER — Inpatient Hospital Stay: Admission: RE | Admit: 2021-07-30 | Payer: BC Managed Care – PPO | Source: Ambulatory Visit

## 2021-08-03 ENCOUNTER — Other Ambulatory Visit: Payer: Self-pay | Admitting: Dermatology

## 2021-08-03 DIAGNOSIS — L661 Lichen planopilaris: Secondary | ICD-10-CM

## 2021-08-17 DIAGNOSIS — H40003 Preglaucoma, unspecified, bilateral: Secondary | ICD-10-CM | POA: Diagnosis not present

## 2021-08-23 ENCOUNTER — Other Ambulatory Visit: Payer: Self-pay

## 2021-08-23 ENCOUNTER — Other Ambulatory Visit: Payer: Self-pay | Admitting: Obstetrics and Gynecology

## 2021-08-23 ENCOUNTER — Ambulatory Visit (INDEPENDENT_AMBULATORY_CARE_PROVIDER_SITE_OTHER): Payer: BC Managed Care – PPO | Admitting: Obstetrics and Gynecology

## 2021-08-23 ENCOUNTER — Other Ambulatory Visit (HOSPITAL_COMMUNITY)
Admission: RE | Admit: 2021-08-23 | Discharge: 2021-08-23 | Disposition: A | Discharge status: Home / Self Care | Payer: BC Managed Care – PPO | Source: Ambulatory Visit | Attending: Obstetrics and Gynecology | Admitting: Obstetrics and Gynecology

## 2021-08-23 ENCOUNTER — Encounter: Payer: Self-pay | Admitting: Obstetrics and Gynecology

## 2021-08-23 VITALS — BP 108/60 | HR 77 | Ht 67.5 in | Wt 123.0 lb

## 2021-08-23 DIAGNOSIS — Z87411 Personal history of vaginal dysplasia: Secondary | ICD-10-CM

## 2021-08-23 DIAGNOSIS — Z86001 Personal history of in-situ neoplasm of cervix uteri: Secondary | ICD-10-CM | POA: Diagnosis not present

## 2021-08-23 DIAGNOSIS — Z01419 Encounter for gynecological examination (general) (routine) without abnormal findings: Secondary | ICD-10-CM | POA: Diagnosis not present

## 2021-08-23 NOTE — Progress Notes (Signed)
60 y.o. G70P0000 Married Caucasian female here for annual exam.    No concerns.  Feels good.   Stopped taking her Plaquenil for alopecia.  PCP:  Viviana Simpler, MD  Patient's last menstrual period was 09/30/1997 (approximate).           Sexually active: Yes.    The current method of family planning is status post hysterectomy.    Exercising: Yes.     Jogs 3 miles/3x/week, pilates/yoga Smoker:  no  Health Maintenance: Pap:  08-17-20 Neg:Neg HR HPV, 07-17-19 Neg, 07-06-18 Neg:Neg HR HPV History of abnormal Pap:  yes,  Hx of cervical cancer MMG: 07-29-20  3D/Neg/BiRads1 Colonoscopy: 08-21-20 normal;10 years BMD:  n/a  Result  n/a TDaP:  06-12-20 Td Gardasil:   no HIV: 2018 NR Hep C: 2018 Neg Screening Labs:  today.  Flu vaccine:  does not do.    reports that she has never smoked. She has never used smokeless tobacco. She reports that she does not currently use alcohol after a past usage of about 3.0 standard drinks per week. She reports that she does not use drugs.  Past Medical History:  Diagnosis Date   Cervical cancer ([place]) 1998   CIS   Scarring alopecia 04/2016   Vitamin D deficiency 2008   Yeast infection    chronic yeast infection     Past Surgical History:  Procedure Laterality Date   CERVICAL CONE BIOPSY  04/02/1997   Squamous cell caarcinoma in situ of cervix   COLONOSCOPY  07/2011   normal repeat in 10 years   COLPOSCOPY  4/04   vain I   left arm surgery     1980   METATARSAL OSTEOTOMY Left 01/2015   repair of Hallux rigidus with Condylectomy   TOTAL VAGINAL HYSTERECTOMY  10/08/1997   [facility] [place], [place] [facility]    Current Outpatient Medications  Medication Sig Dispense Refill   bimatoprost (LATISSE) 0.03 % ophthalmic solution Place one drop on applicator and apply evenly along the skin of the upper eyelid at base of eyelashes once daily at bedtime; repeat procedure for second eye (use a clean applicator). 3 mL 12   Biotin 10 MG CAPS Take by mouth.     Calcium  Carb-Cholecalciferol (CALCIUM 1000 + D PO) Take by mouth.     clobetasol (OLUX) 0.05 % topical foam Apply topically as directed. Qd up to 5 days a week to aa scalp, avoid face, groin, axilla 50 g 2   COCONUT OIL PO Take by mouth.     Flaxseed, Linseed, (FLAX SEED OIL PO) Take by mouth.     ketoconazole (NIZORAL) 2 % shampoo MASSAGE INTO SCALP AND LET SIT FOR AT LEAST 5 MINUTES BEFORE WASHING OUT, USE THREE TIMES WEEKLY. 120 mL 5   Multiple Vitamin (MULTIVITAMIN) tablet Take 1 tablet by mouth daily.     tacrolimus (PROTOPIC) 0.1 % ointment APPLY TOPICALLY TO HAIRLINE AT BEDTIME. 60 g 5   tretinoin (RETIN-A) 0.1 % cream Apply topically at bedtime. 45 g 5   No current facility-administered medications for this visit.    Family History  Problem Relation Age of Onset   Hypertension Maternal Aunt    Pancreatic cancer Maternal Aunt 92   Thyroid disease Mother    Stomach cancer Cousin 33   Cancer Paternal Aunt        ovarian cancer    Diabetes Maternal Grandmother    Diabetes Paternal Grandmother    Heart failure Paternal Grandfather  Colon cancer Neg Hx    Esophageal cancer Neg Hx    Rectal cancer Neg Hx     Review of Systems  All other systems reviewed and are negative.  Exam:   BP 108/60   Pulse 77   Ht 5' 7.5" (1.715 m)   Wt 123 lb (55.8 kg)   LMP 09/30/1997 (Approximate)   SpO2 97%   BMI 18.98 kg/m     General appearance: alert, cooperative and appears stated age Head: normocephalic, without obvious abnormality, atraumatic Neck: no adenopathy, supple, symmetrical, trachea midline and thyroid normal to inspection and palpation Lungs: clear to auscultation bilaterally Breasts: normal appearance, no masses or tenderness, No nipple retraction or dimpling, No nipple discharge or bleeding, No axillary adenopathy Heart: regular rate and rhythm Abdomen: soft, non-tender; no masses, no organomegaly Extremities: extremities normal, atraumatic, no cyanosis or edema Skin: skin  color, texture, turgor normal. No rashes or lesions Lymph nodes: cervical, supraclavicular, and axillary nodes normal. Neurologic: grossly normal  Pelvic: External genitalia:  no lesions              No abnormal inguinal nodes palpated.              Urethra:  normal appearing urethra with no masses, tenderness or lesions              Bartholins and Skenes: normal                 Vagina: normal appearing vagina with normal color and discharge, no lesions              Cervix: absent              Pap taken: yes Bimanual Exam:  Uterus:  normal size, contour, position, consistency, mobility, non-tender              Adnexa: no mass, fullness, tenderness              Rectal exam: yes.  Confirms.              Anus:  normal sphincter tone, no lesions  Chaperone was present for exam:  yes.   Assessment:   Well woman visit with gynecologic exam. Status post TVH for CIS.   Ovaries remain.  Hx VAIN I. Frontal temporal alopecia.  Plan: Mammogram screening discussed. Self breast awareness reviewed. Pap and HR HPV as above. Guidelines for Calcium, Vitamin D, regular exercise program including cardiovascular and weight bearing exercise. Routine labs. Follow up annually and prn.    After visit summary provided.

## 2021-08-23 NOTE — Patient Instructions (Signed)

## 2021-08-24 LAB — COMPREHENSIVE METABOLIC PANEL
AG Ratio: 1.7 (calc) (ref 1.0–2.5)
ALT: 20 U/L (ref 6–29)
AST: 29 U/L (ref 10–35)
Albumin: 4.3 g/dL (ref 3.6–5.1)
Alkaline phosphatase (APISO): 49 U/L (ref 37–153)
BUN: 15 mg/dL (ref 7–25)
CO2: 27 mmol/L (ref 20–32)
Calcium: 9.8 mg/dL (ref 8.6–10.4)
Chloride: 105 mmol/L (ref 98–110)
Creat: 0.84 mg/dL (ref 0.50–1.05)
Globulin: 2.5 g/dL (calc) (ref 1.9–3.7)
Glucose, Bld: 86 mg/dL (ref 65–99)
Potassium: 4.7 mmol/L (ref 3.5–5.3)
Sodium: 141 mmol/L (ref 135–146)
Total Bilirubin: 0.4 mg/dL (ref 0.2–1.2)
Total Protein: 6.8 g/dL (ref 6.1–8.1)

## 2021-08-24 LAB — CYTOLOGY - PAP
Comment: NEGATIVE
Diagnosis: NEGATIVE
High risk HPV: NEGATIVE

## 2021-08-24 LAB — LIPID PANEL
Cholesterol: 203 mg/dL — ABNORMAL HIGH (ref <200)
HDL: 79 mg/dL (ref >=50)
LDL Cholesterol (Calc): 99 mg/dL (calc)
Non-HDL Cholesterol (Calc): 124 mg/dL (calc) (ref <130)
Total CHOL/HDL Ratio: 2.6 (calc) (ref <5.0)
Triglycerides: 152 mg/dL — ABNORMAL HIGH (ref <150)

## 2021-08-24 LAB — CBC
HCT: 41.6 % (ref 35.0–45.0)
Hemoglobin: 14.2 g/dL (ref 11.7–15.5)
MCH: 31.3 pg (ref 27.0–33.0)
MCHC: 34.1 g/dL (ref 32.0–36.0)
MCV: 91.6 fL (ref 80.0–100.0)
MPV: 10.1 fL (ref 7.5–12.5)
Platelets: 289 10*3/uL (ref 140–400)
RBC: 4.54 10*6/uL (ref 3.80–5.10)
RDW: 12.5 % (ref 11.0–15.0)
WBC: 4.6 10*3/uL (ref 3.8–10.8)

## 2021-08-27 ENCOUNTER — Ambulatory Visit: Payer: BC Managed Care – PPO

## 2021-09-10 ENCOUNTER — Ambulatory Visit
Admission: RE | Admit: 2021-09-10 | Discharge: 2021-09-10 | Disposition: A | Discharge status: Home / Self Care | Payer: BC Managed Care – PPO | Source: Ambulatory Visit | Attending: Obstetrics and Gynecology | Admitting: Obstetrics and Gynecology

## 2021-09-10 ENCOUNTER — Other Ambulatory Visit: Payer: Self-pay

## 2021-09-10 DIAGNOSIS — Z1231 Encounter for screening mammogram for malignant neoplasm of breast: Secondary | ICD-10-CM | POA: Diagnosis not present

## 2022-03-10 IMAGING — MG MM DIGITAL SCREENING BILAT W/ TOMO AND CAD
8 series · 9 of 24 positions shown · non-contrast
Comparison: Previous exam(s).

CLINICAL DATA: Screening.

EXAM:
DIGITAL SCREENING BILATERAL MAMMOGRAM WITH TOMOSYNTHESIS AND CAD
TECHNIQUE: Bilateral screening digital craniocaudal and mediolateral oblique
mammograms were obtained. Bilateral screening digital breast
tomosynthesis was performed. The images were evaluated with
computer-aided detection.

[R MLO synth-2D]
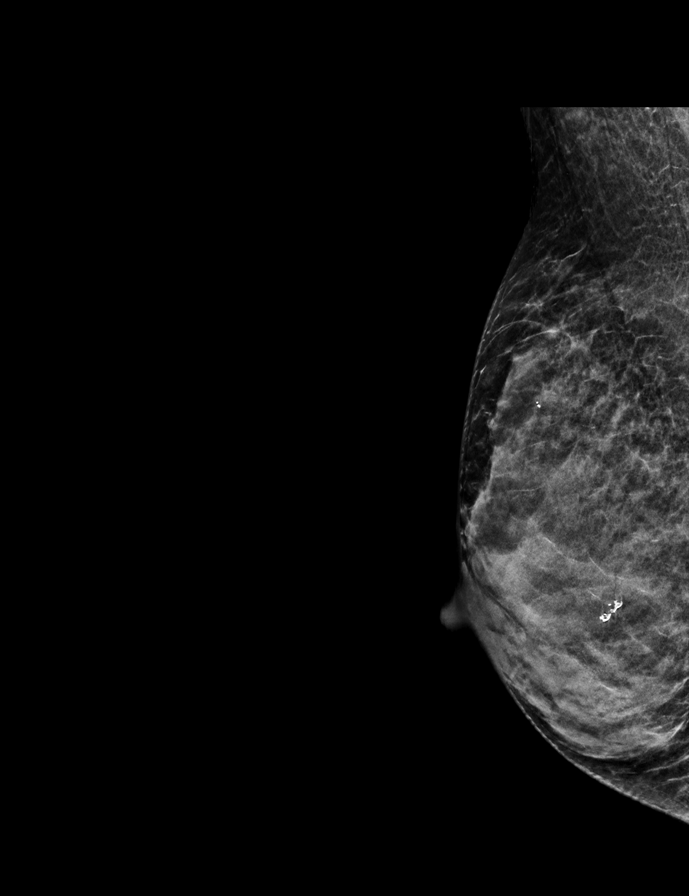

[L MLO synth-2D]
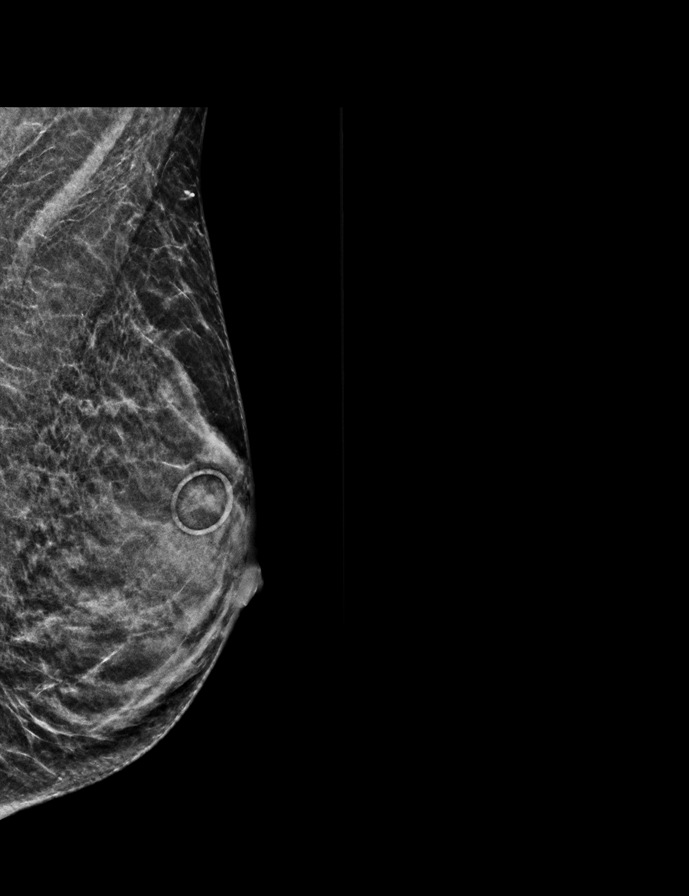

[R CC synth-2D]
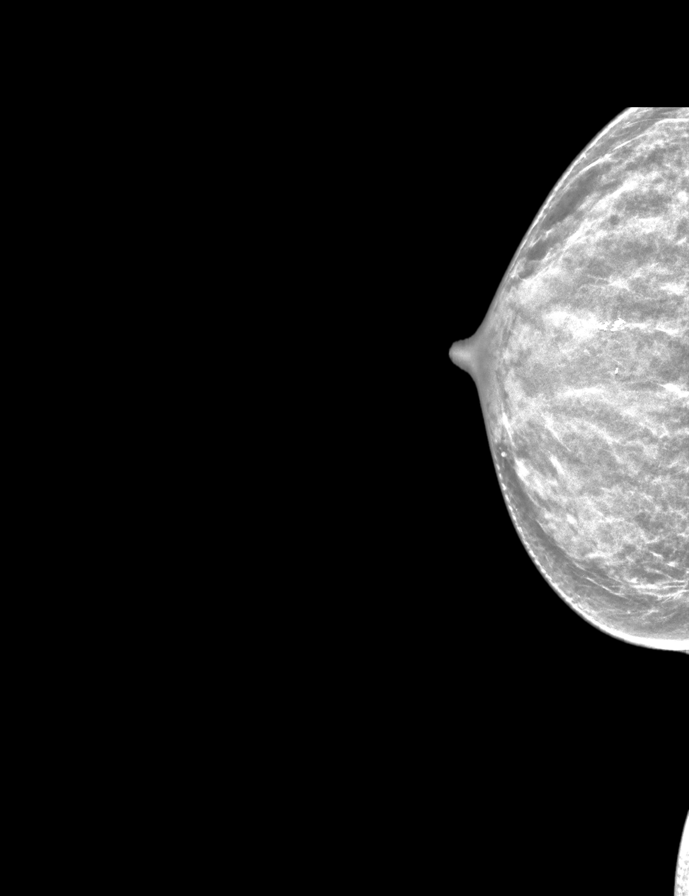

[L CC synth-2D]
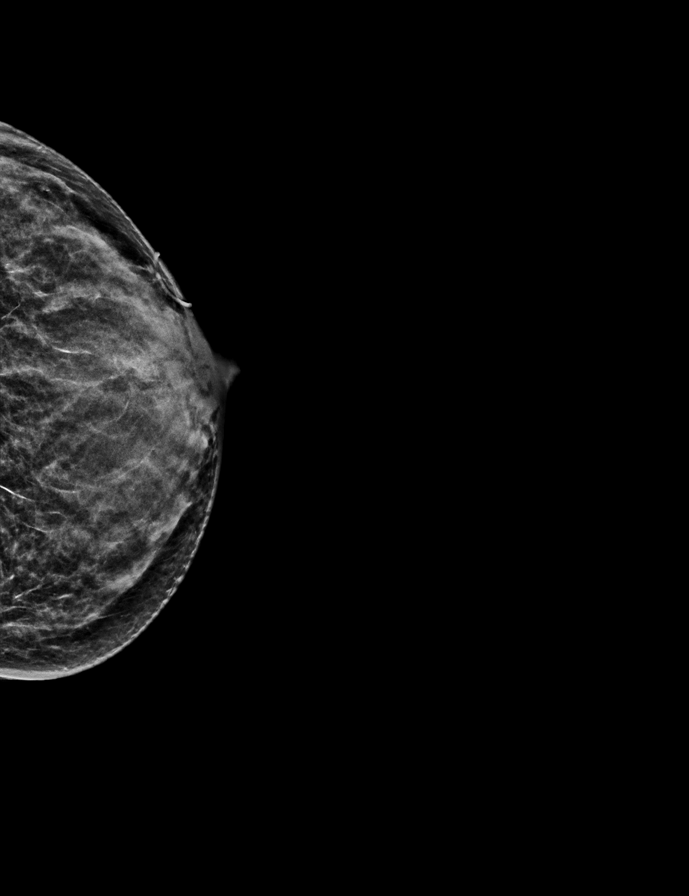

[R MLO tomo · 2 of 39 frames shown]
[frame 13/39]
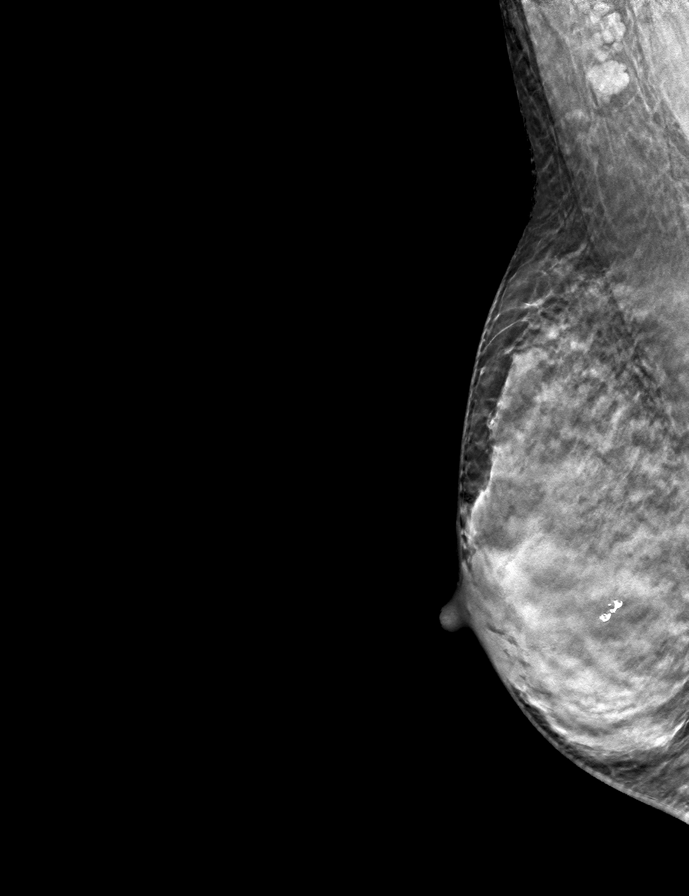
[frame 20/39]
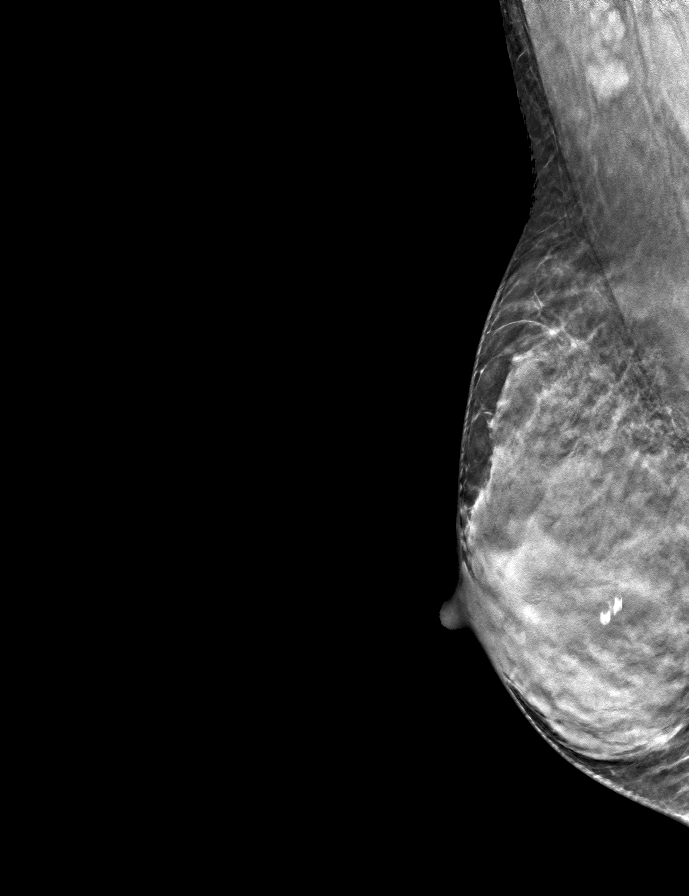

[L MLO tomo · tomo slice 18/35.0]
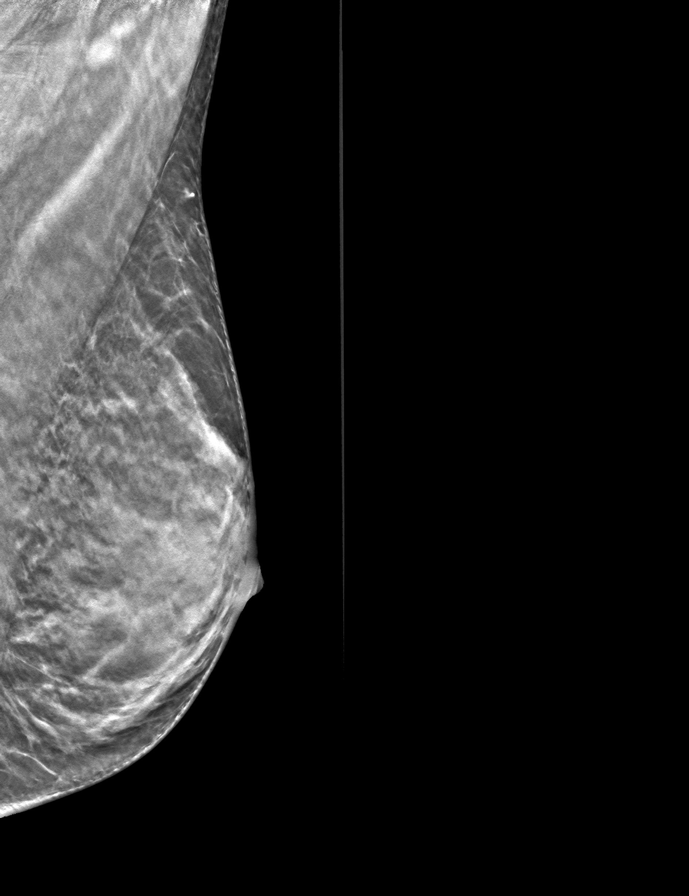

[L CC tomo · tomo slice 19/38.0]
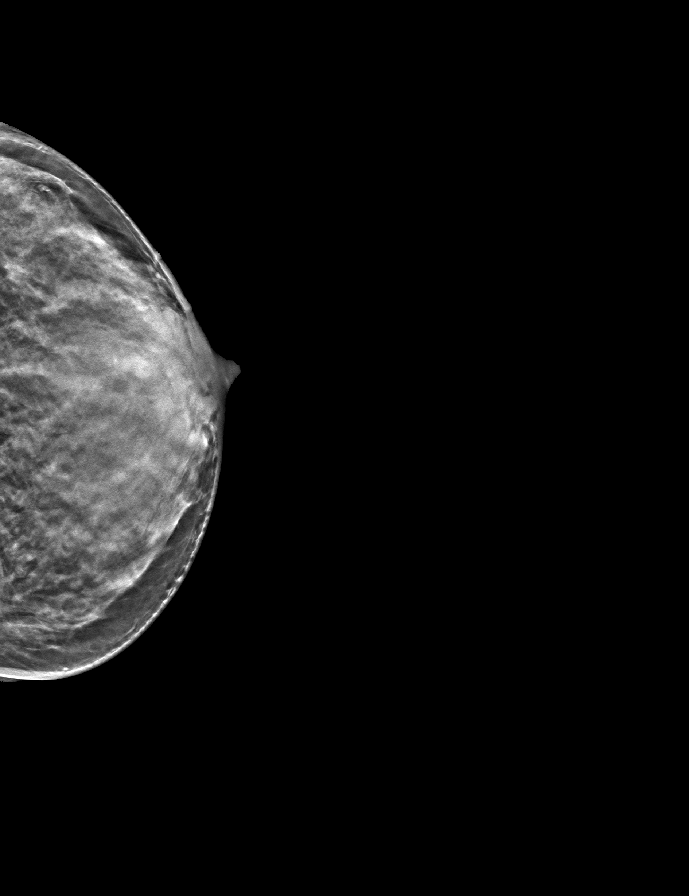

[R CC tomo · tomo slice 21/41.0]
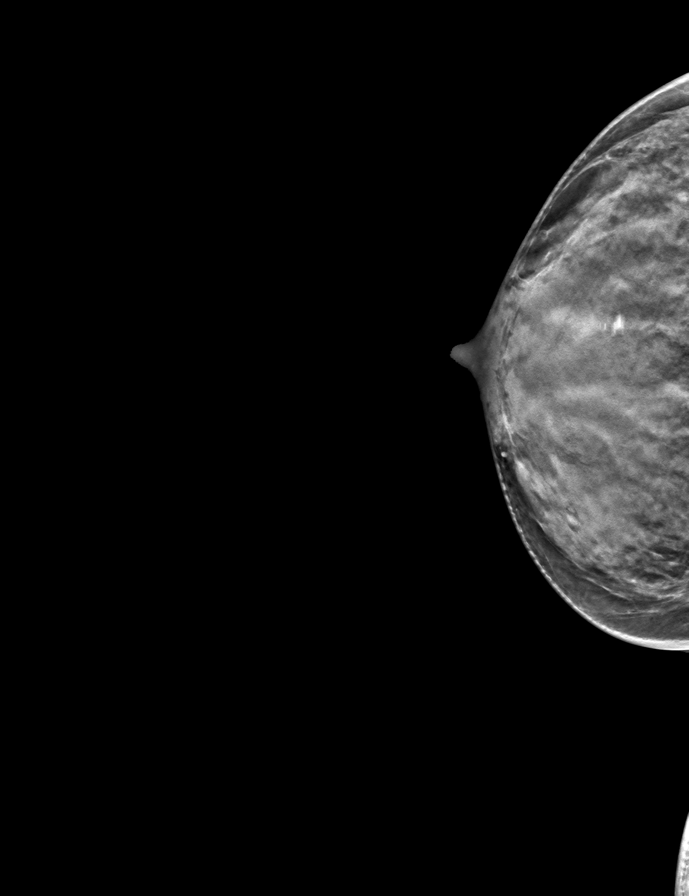

[9 of 24 positions shown; findings below may reference images not displayed]

ACR Breast Density Category d: The breast tissue is extremely dense,
which lowers the sensitivity of mammography
FINDINGS: There are no findings suspicious for malignancy.
IMPRESSION: No mammographic evidence of malignancy. A result letter of this
screening mammogram will be mailed directly to the patient.

RECOMMENDATION:
Screening mammogram in one year. (Code:TA-V-WV9)

BI-RADS CATEGORY  1: Negative.

## 2022-06-20 ENCOUNTER — Encounter: Payer: Self-pay | Admitting: Internal Medicine

## 2022-06-20 ENCOUNTER — Ambulatory Visit (INDEPENDENT_AMBULATORY_CARE_PROVIDER_SITE_OTHER): Payer: 59 | Admitting: Internal Medicine

## 2022-06-20 VITALS — BP 100/66 | HR 50 | Temp 97.6°F | Ht 68.0 in | Wt 128.0 lb

## 2022-06-20 DIAGNOSIS — Z0000 Encounter for general adult medical examination without abnormal findings: Secondary | ICD-10-CM

## 2022-06-20 DIAGNOSIS — L669 Cicatricial alopecia, unspecified: Secondary | ICD-10-CM

## 2022-06-20 NOTE — Assessment & Plan Note (Signed)
Healthy Exercises regularly Colon due again 2031 Pap--- probably one more at 56 Yearly mammogram Prefers no COVID, flu or shingrix vaccines

## 2022-06-20 NOTE — Progress Notes (Signed)
Subjective:    Patient ID: Natasha Mccann, female    DOB: 1961/05/03, 61 y.o.   MRN: [id number]  HPI Here for physical  No longer on plaquenil Just uses shampoo for alopecia  No new concerns  Current Outpatient Medications on File Prior to Visit  Medication Sig Dispense Refill   Biotin 10 MG CAPS Take by mouth.     Calcium Carb-Cholecalciferol (CALCIUM 1000 + D PO) Take by mouth.     clobetasol (OLUX) 0.05 % topical foam Apply topically as directed. Qd up to 5 days a week to aa scalp, avoid face, groin, axilla 50 g 2   COCONUT OIL PO Take by mouth.     Flaxseed, Linseed, (FLAX SEED OIL PO) Take by mouth.     ketoconazole (NIZORAL) 2 % shampoo MASSAGE INTO SCALP AND LET SIT FOR AT LEAST 5 MINUTES BEFORE WASHING OUT, USE THREE TIMES WEEKLY. 120 mL 5   Multiple Vitamin (MULTIVITAMIN) tablet Take 1 tablet by mouth daily.     No current facility-administered medications on file prior to visit.    Allergies  Allergen Reactions   Hydrocodone     Stomach upset    Past Medical History:  Diagnosis Date   Cervical cancer ([place]) 1998   CIS   Scarring alopecia 04/2016   Vitamin D deficiency 2008   Yeast infection    chronic yeast infection     Past Surgical History:  Procedure Laterality Date   ABDOMINAL HYSTERECTOMY     CERVICAL CONE BIOPSY  04/02/1997   Squamous cell caarcinoma in situ of cervix   COLONOSCOPY  07/2011   normal repeat in 10 years   COLPOSCOPY  01/2003   vain I   left arm surgery     1980   METATARSAL OSTEOTOMY Left 01/2015   repair of Hallux rigidus with Condylectomy   TOTAL VAGINAL HYSTERECTOMY  10/08/1997   [facility] [place], [place] [facility]    Family History  Problem Relation Age of Onset   Hypertension Maternal Aunt    Pancreatic cancer Maternal Aunt 92   Thyroid disease Mother    Stomach cancer Cousin 67   Cancer Paternal Aunt        ovarian cancer    Diabetes Maternal Grandmother    Diabetes Paternal Grandmother    Heart failure Paternal  Grandfather    Colon cancer Neg Hx    Esophageal cancer Neg Hx    Rectal cancer Neg Hx     Social History   Socioeconomic History   Marital status: Married    Spouse name: Not on file   Number of children: 0   Years of education: Not on file   Highest education level: Not on file  Occupational History   Occupation: Engineer, maintenance (IT)    Comment: [employer]  Tobacco Use   Smoking status: Never    Passive exposure: Never   Smokeless tobacco: Never  Vaping Use   Vaping Use: Never used  Substance and Sexual Activity   Alcohol use: Not Currently    Alcohol/week: 3.0 standard drinks of alcohol    Types: 3 Cans of beer per week   Drug use: No   Sexual activity: Yes    Birth control/protection: Surgical    Comment: TVH  Other Topics Concern   Not on file  Social History Narrative   Not on file   Social Determinants of Health   Financial Resource Strain: Not on file  Food Insecurity: Not on file  Transportation Needs:  Not on file  Physical Activity: Not on file  Stress: Not on file  Social Connections: Not on file  Intimate Partner Violence: Not on file   Review of Systems  Constitutional:  Negative for fatigue and unexpected weight change.       Jogs most days and does yoga/Pilates Wears seat belt  HENT:  Negative for dental problem, hearing loss and tinnitus.        Keeps up with the dentist  Eyes:  Negative for visual disturbance.       No diplopia or unilateral vision loss  Respiratory:  Negative for cough, chest tightness and shortness of breath.   Cardiovascular:  Negative for chest pain, palpitations and leg swelling.  Gastrointestinal:  Negative for blood in stool and constipation.       No heartburn  Endocrine: Negative for polydipsia and polyuria.  Genitourinary:  Negative for dyspareunia, dysuria and hematuria.  Musculoskeletal:  Negative for arthralgias, back pain and joint swelling.  Skin:  Negative for rash.  Allergic/Immunologic: Negative for environmental  allergies and immunocompromised state.  Neurological:  Negative for dizziness, syncope, light-headedness and headaches.  Hematological:  Negative for adenopathy. Does not bruise/bleed easily.  Psychiatric/Behavioral:  Negative for dysphoric mood and sleep disturbance. The patient is not nervous/anxious.        Objective:   Physical Exam Constitutional:      Appearance: Normal appearance.  HENT:     Mouth/Throat:     Pharynx: No oropharyngeal exudate or posterior oropharyngeal erythema.  Eyes:     Conjunctiva/sclera: Conjunctivae normal.     Pupils: Pupils are equal, round, and reactive to light.  Cardiovascular:     Rate and Rhythm: Normal rate and regular rhythm.     Pulses: Normal pulses.     Heart sounds: No murmur heard.    No gallop.  Pulmonary:     Effort: Pulmonary effort is normal.     Breath sounds: Normal breath sounds. No wheezing or rales.  Abdominal:     Palpations: Abdomen is soft.     Tenderness: There is no abdominal tenderness.  Musculoskeletal:     Cervical back: Neck supple.     Right lower leg: No edema.     Left lower leg: No edema.  Lymphadenopathy:     Cervical: No cervical adenopathy.  Skin:    Findings: No lesion or rash.  Neurological:     General: No focal deficit present.     Mental Status: She is alert and oriented to person, place, and time.  Psychiatric:        Mood and Affect: Mood normal.        Behavior: Behavior normal.            Assessment & Plan:

## 2022-06-20 NOTE — Assessment & Plan Note (Signed)
Now just uses shampoo

## 2022-06-21 LAB — CBC
HCT: 41.7 % (ref 36.0–46.0)
Hemoglobin: 14.1 g/dL (ref 12.0–15.0)
MCHC: 33.9 g/dL (ref 30.0–36.0)
MCV: 89.8 fl (ref 78.0–100.0)
Platelets: 248 10*3/uL (ref 150.0–400.0)
RBC: 4.65 Mil/uL (ref 3.87–5.11)
RDW: 13.2 % (ref 11.5–15.5)
WBC: 5.6 10*3/uL (ref 4.0–10.5)

## 2022-06-21 LAB — COMPREHENSIVE METABOLIC PANEL
ALT: 29 U/L (ref 0–35)
AST: 32 U/L (ref 0–37)
Albumin: 4.5 g/dL (ref 3.5–5.2)
Alkaline Phosphatase: 57 U/L (ref 39–117)
BUN: 11 mg/dL (ref 6–23)
CO2: 29 mEq/L (ref 19–32)
Calcium: 9.9 mg/dL (ref 8.4–10.5)
Chloride: 103 mEq/L (ref 96–112)
Creatinine, Ser: 0.85 mg/dL (ref 0.40–1.20)
GFR: 73.44 mL/min (ref >60.00)
Glucose, Bld: 79 mg/dL (ref 70–99)
Potassium: 3.7 mEq/L (ref 3.5–5.1)
Sodium: 139 mEq/L (ref 135–145)
Total Bilirubin: 0.5 mg/dL (ref 0.2–1.2)
Total Protein: 7 g/dL (ref 6.0–8.3)

## 2022-08-30 ENCOUNTER — Other Ambulatory Visit (HOSPITAL_COMMUNITY)
Admission: RE | Admit: 2022-08-30 | Discharge: 2022-08-30 | Disposition: A | Discharge status: Home / Self Care | Payer: 59 | Source: Ambulatory Visit | Attending: Obstetrics and Gynecology | Admitting: Obstetrics and Gynecology

## 2022-08-30 ENCOUNTER — Ambulatory Visit (INDEPENDENT_AMBULATORY_CARE_PROVIDER_SITE_OTHER): Payer: 59 | Admitting: Obstetrics and Gynecology

## 2022-08-30 ENCOUNTER — Ambulatory Visit: Payer: BC Managed Care – PPO | Admitting: Obstetrics and Gynecology

## 2022-08-30 ENCOUNTER — Encounter: Payer: Self-pay | Admitting: Obstetrics and Gynecology

## 2022-08-30 VITALS — BP 122/80 | HR 74 | Resp 14 | Ht 67.0 in | Wt 130.0 lb

## 2022-08-30 DIAGNOSIS — Z01419 Encounter for gynecological examination (general) (routine) without abnormal findings: Secondary | ICD-10-CM | POA: Diagnosis not present

## 2022-08-30 DIAGNOSIS — Z86001 Personal history of in-situ neoplasm of cervix uteri: Secondary | ICD-10-CM

## 2022-08-30 DIAGNOSIS — Z1272 Encounter for screening for malignant neoplasm of vagina: Secondary | ICD-10-CM

## 2022-08-30 NOTE — Progress Notes (Signed)
61 y.o. G0P0000 Married Caucasian female here for annual exam.    Some vaginal dryness.  PCP:   Viviana Simpler, MD  Patient's last menstrual period was 09/30/1997 (approximate).           Sexually active: Yes.    The current method of family planning is post menopausal status.    Exercising: Yes.    Home exercise routine includes walking 1 hrs per day and yoga. Gym/ health club routine includes yoga. Smoker:  no  Health Maintenance: Pap:  08/23/2021 neg:neg Hr HPV, 08-17-20 Neg:Neg HR HPV, 07-17-19 Neg, 07-06-18 Neg:Neg HR HPV History of abnormal Pap:  yes  Hx of cervical cancer in situ MMG:  09/10/2021 BI-RADS CATEGORY  1: Negative.  She will schedule.   Colonoscopy:  08/21/2020 - due in 10 years.  BMD:   n/a TDaP:  06/12/2020 Gardasil:   n/a HIV:  2018 NR Hep C:  2018 NR Screening Labs:  PCP. Flu vaccine:  declined.  Covid vaccine: discussed.   reports that she has never smoked. She has never been exposed to tobacco smoke. She has never used smokeless tobacco. She reports current alcohol use of about 3.0 standard drinks of alcohol per week. She reports that she does not use drugs.  Past Medical History:  Diagnosis Date   Cervical cancer ([place]) 1998   CIS   Scarring alopecia 04/2016   Vitamin D deficiency 2008   Yeast infection    chronic yeast infection     Past Surgical History:  Procedure Laterality Date   ABDOMINAL HYSTERECTOMY     CERVICAL CONE BIOPSY  04/02/1997   Squamous cell caarcinoma in situ of cervix   COLONOSCOPY  07/2011   normal repeat in 10 years   COLPOSCOPY  01/2003   vain I   left arm surgery     1980   METATARSAL OSTEOTOMY Left 01/2015   repair of Hallux rigidus with Condylectomy   TOTAL VAGINAL HYSTERECTOMY  10/08/1997   [facility] [place], [place] [facility]    Current Outpatient Medications  Medication Sig Dispense Refill   Biotin 10 MG CAPS Take by mouth.     Calcium Carb-Cholecalciferol (CALCIUM 1000 + D PO) Take by mouth.     clobetasol (OLUX)  0.05 % topical foam Apply topically as directed. Qd up to 5 days a week to aa scalp, avoid face, groin, axilla 50 g 2   COCONUT OIL PO Take by mouth.     Flaxseed, Linseed, (FLAX SEED OIL PO) Take by mouth.     Multiple Vitamin (MULTIVITAMIN) tablet Take 1 tablet by mouth daily.     ketoconazole (NIZORAL) 2 % shampoo MASSAGE INTO SCALP AND LET SIT FOR AT LEAST 5 MINUTES BEFORE WASHING OUT, USE THREE TIMES WEEKLY. (Patient not taking: Reported on 08/30/2022) 120 mL 5   No current facility-administered medications for this visit.    Family History  Problem Relation Age of Onset   Hypertension Maternal Aunt    Pancreatic cancer Maternal Aunt 92   Thyroid disease Mother    Stomach cancer Cousin 24   Cancer Paternal Aunt        ovarian cancer    Diabetes Maternal Grandmother    Diabetes Paternal Grandmother    Heart failure Paternal Grandfather    Colon cancer Neg Hx    Esophageal cancer Neg Hx    Rectal cancer Neg Hx     Review of Systems  All other systems reviewed and are negative.   Exam:  BP 122/80 (BP Location: Left Arm, Patient Position: Sitting, Cuff Size: Normal)   Pulse 74   Resp 14   Ht '5\' 7"'$  (1.702 m)   Wt 130 lb (59 kg)   LMP 09/30/1997 (Approximate)   BMI 20.36 kg/m     General appearance: alert, cooperative and appears stated age Head: normocephalic, without obvious abnormality, atraumatic Neck: no adenopathy, supple, symmetrical, trachea midline and thyroid normal to inspection and palpation Lungs: clear to auscultation bilaterally Breasts: normal appearance, no masses or tenderness, No nipple retraction or dimpling, No nipple discharge or bleeding, No axillary adenopathy Heart: regular rate and rhythm Abdomen: soft, non-tender; no masses, no organomegaly Extremities: extremities normal, atraumatic, no cyanosis or edema Skin: skin color, texture, turgor normal. No rashes or lesions Lymph nodes: cervical, supraclavicular, and axillary nodes  normal. Neurologic: grossly normal  Pelvic: External genitalia:  no lesions              No abnormal inguinal nodes palpated.              Urethra:  normal appearing urethra with no masses, tenderness or lesions              Bartholins and Skenes: normal                 Vagina: normal appearing vagina with normal color and discharge, no lesions              Cervix: absent.  Friable vaginal cuff.               Pap taken: yes Bimanual Exam:  Uterus:  normal size, contour, position, consistency, mobility, non-tender              Adnexa: no mass, fullness, tenderness              Rectal exam: yes.  Confirms.              Anus:  normal sphincter tone, no lesions  Chaperone was present for exam:  Kimalexis, CMA  Assessment:   Well woman visit with gynecologic exam. Status post TVH for CIS.   Ovaries remain.  Hx VAIN I. Vaginal atrophy.  Frontal temporal alopecia.  Plan: Mammogram screening discussed. Self breast awareness reviewed. Pap and HR HPV collected.   I did discuss decreasing the frequency of her paps, which we will consider for next year.  Guidelines for Calcium, Vitamin D, regular exercise program including cardiovascular and weight bearing exercise. I dicussed treatments for atrophy - water based lubricants, cooking oils, vit E, and vaginal estrogens.   She will try cooking oil for now. Follow up annually and prn.   After visit summary provided.

## 2022-08-30 NOTE — Patient Instructions (Signed)

## 2022-09-01 LAB — CYTOLOGY - PAP
Comment: NEGATIVE
Diagnosis: NEGATIVE
High risk HPV: NEGATIVE

## 2022-09-07 ENCOUNTER — Other Ambulatory Visit: Payer: Self-pay | Admitting: Obstetrics and Gynecology

## 2022-09-07 DIAGNOSIS — Z1231 Encounter for screening mammogram for malignant neoplasm of breast: Secondary | ICD-10-CM

## 2022-09-20 ENCOUNTER — Ambulatory Visit
Admission: RE | Admit: 2022-09-20 | Discharge: 2022-09-20 | Disposition: A | Discharge status: Home / Self Care | Payer: 59 | Source: Ambulatory Visit | Attending: Obstetrics and Gynecology | Admitting: Obstetrics and Gynecology

## 2022-09-20 DIAGNOSIS — Z1231 Encounter for screening mammogram for malignant neoplasm of breast: Secondary | ICD-10-CM | POA: Insufficient documentation

## 2022-11-08 ENCOUNTER — Ambulatory Visit: Payer: 59

## 2023-06-22 ENCOUNTER — Ambulatory Visit (INDEPENDENT_AMBULATORY_CARE_PROVIDER_SITE_OTHER): Payer: 59 | Admitting: Internal Medicine

## 2023-06-22 ENCOUNTER — Encounter: Payer: Self-pay | Admitting: Internal Medicine

## 2023-06-22 VITALS — BP 124/80 | HR 70 | Temp 97.7°F | Ht 67.91 in | Wt 123.4 lb

## 2023-06-22 DIAGNOSIS — L669 Cicatricial alopecia, unspecified: Secondary | ICD-10-CM | POA: Diagnosis not present

## 2023-06-22 DIAGNOSIS — Z0000 Encounter for general adult medical examination without abnormal findings: Secondary | ICD-10-CM

## 2023-06-22 DIAGNOSIS — E785 Hyperlipidemia, unspecified: Secondary | ICD-10-CM | POA: Diagnosis not present

## 2023-06-22 NOTE — Progress Notes (Signed)
Subjective:    Patient ID: Natasha Mccann, female    DOB: 1961/08/07, 62 y.o.   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     Calcium Carb-Cholecalciferol (CALCIUM 1000 + D PO) Take by mouth.     clobetasol (OLUX) 0.05 % topical foam Apply topically as directed. Qd up to 5 days a week to aa scalp, avoid face, groin, axilla 50 g 2   COCONUT OIL PO Take by mouth.     Flaxseed, Linseed, (FLAX SEED OIL PO) Take by mouth.     ketoconazole (NIZORAL) 2 % shampoo MASSAGE INTO SCALP AND LET SIT FOR AT LEAST 5 MINUTES BEFORE WASHING OUT, USE THREE TIMES WEEKLY. 120 mL 5   Multiple Vitamin (MULTIVITAMIN) tablet Take 1 tablet by mouth daily.     No current facility-administered medications on file prior to visit.    Allergies  Allergen Reactions   Hydrocodone     Stomach upset    Past Medical History:  Diagnosis Date   Cervical cancer ([facility]) 1998   CIS   Scarring alopecia 04/2016   Vitamin D deficiency 2008   Yeast infection    chronic yeast infection     Past Surgical History:  Procedure Laterality Date   ABDOMINAL HYSTERECTOMY     CERVICAL CONE BIOPSY  04/02/1997   Squamous cell caarcinoma in situ of cervix   COLONOSCOPY  07/2011   normal repeat in 10 years   COLPOSCOPY  01/2003   vain I   left arm surgery     1980   METATARSAL OSTEOTOMY Left 01/2015   repair of Hallux rigidus with Condylectomy   TOTAL VAGINAL HYSTERECTOMY  10/08/1997   [facility] [place], [facility] [facility]    Family History  Problem Relation Age of Onset   Hypertension Maternal Aunt    Pancreatic cancer Maternal Aunt 57   Thyroid disease Mother    Stomach cancer Cousin 60   Cancer Paternal Aunt        ovarian cancer    Diabetes Maternal  Grandmother    Diabetes Paternal Grandmother    Heart failure Paternal Grandfather    Colon cancer Neg Hx    Esophageal cancer Neg Hx    Rectal cancer Neg Hx     Social History   Socioeconomic History   Marital status: Married    Spouse name: Not on file   Number of children: 0   Years of education: Not on file   Highest education level: Not on file  Occupational History   Occupation: IT trainer    Comment: [employer]  Tobacco Use   Smoking status: Never    Passive exposure: Never   Smokeless tobacco: Never  Vaping Use   Vaping status: Never Used  Substance and Sexual Activity   Alcohol use: Yes    Alcohol/week: 3.0 standard drinks of alcohol    Types: 3 Cans of beer per week   Drug use: No   Sexual activity: Yes    Birth control/protection: Surgical    Comment: TVH  Other Topics Concern   Not on file  Social History Narrative   Not on file   Social Determinants of Health   Financial Resource Strain: Not on file  Food Insecurity: Not on file  Transportation Needs: Not on file  Physical Activity: Not on file  Stress: Not on file  Social Connections: Not on file  Intimate Partner Violence: Not on file   Review of Systems  Constitutional:  Negative for fatigue and unexpected weight change.       Wears seat belt  HENT:  Negative for dental problem, hearing loss and tinnitus.        Keeps up with dentist  Eyes:  Negative for visual disturbance.       No diplopia or unilateral vision loss  Respiratory:  Negative for cough, chest tightness and shortness of breath.   Cardiovascular:  Negative for chest pain, palpitations and leg swelling.  Gastrointestinal:  Negative for blood in stool and constipation.       No heartburn  Endocrine: Negative for polydipsia and polyuria.  Genitourinary:  Negative for dyspareunia, dysuria and hematuria.  Musculoskeletal:  Negative for arthralgias, back pain and joint swelling.  Skin:  Negative for rash.  Allergic/Immunologic: Negative  for environmental allergies and immunocompromised state.  Neurological:  Negative for dizziness, syncope, light-headedness and headaches.  Hematological:  Negative for adenopathy. Does not bruise/bleed easily.  Psychiatric/Behavioral:  Negative for decreased concentration and sleep disturbance. The patient is not nervous/anxious.        Objective:   Physical Exam Constitutional:      Appearance: Normal appearance.  HENT:     Mouth/Throat:     Pharynx: No oropharyngeal exudate or posterior oropharyngeal erythema.  Eyes:     Conjunctiva/sclera: Conjunctivae normal.     Pupils: Pupils are equal, round, and reactive to light.  Cardiovascular:     Rate and Rhythm: Normal rate and regular rhythm.     Pulses: Normal pulses.     Heart sounds: No murmur heard.    No gallop.  Pulmonary:     Effort: Pulmonary effort is normal.     Breath sounds: Normal breath sounds. No wheezing or rales.  Abdominal:     Palpations: Abdomen is soft.     Tenderness: There is no abdominal tenderness.  Musculoskeletal:     Cervical back: Neck supple.     Right lower leg: No edema.     Left lower leg: No edema.  Lymphadenopathy:     Cervical: No cervical adenopathy.  Skin:    Findings: No rash.  Neurological:     General: No focal deficit present.     Mental Status: She is alert and oriented to person, place, and time.  Psychiatric:        Mood and Affect: Mood normal.        Behavior: Behavior normal.            Assessment & Plan:

## 2023-06-22 NOTE — Assessment & Plan Note (Signed)
Borderline high but high HDL---so low risk profile No worrisome FH Will just recheck

## 2023-06-22 NOTE — Assessment & Plan Note (Signed)
Only using the shampoo

## 2023-06-22 NOTE — Assessment & Plan Note (Signed)
Healthy Exercises regularly Still prefers no vaccines Colon due again in 2031 Yearly mammogram till at least age 62 Recent pap ---should have one last one in 3-5 years

## 2023-06-23 LAB — LIPID PANEL
Cholesterol: 200 mg/dL (ref 0–200)
HDL: 67.8 mg/dL (ref >39.00)
LDL Cholesterol: 107 mg/dL — ABNORMAL HIGH (ref 0–99)
NonHDL: 132.22
Total CHOL/HDL Ratio: 3
Triglycerides: 126 mg/dL (ref 0.0–149.0)
VLDL: 25.2 mg/dL (ref 0.0–40.0)

## 2023-06-23 LAB — COMPREHENSIVE METABOLIC PANEL
ALT: 23 U/L (ref 0–35)
AST: 26 U/L (ref 0–37)
Albumin: 4.1 g/dL (ref 3.5–5.2)
Alkaline Phosphatase: 61 U/L (ref 39–117)
BUN: 11 mg/dL (ref 6–23)
CO2: 30 meq/L (ref 19–32)
Calcium: 9.4 mg/dL (ref 8.4–10.5)
Chloride: 103 meq/L (ref 96–112)
Creatinine, Ser: 0.95 mg/dL (ref 0.40–1.20)
GFR: 63.81 mL/min (ref >60.00)
Glucose, Bld: 86 mg/dL (ref 70–99)
Potassium: 4.1 meq/L (ref 3.5–5.1)
Sodium: 141 meq/L (ref 135–145)
Total Bilirubin: 0.4 mg/dL (ref 0.2–1.2)
Total Protein: 7 g/dL (ref 6.0–8.3)

## 2023-06-23 LAB — CBC
HCT: 42 % (ref 36.0–46.0)
Hemoglobin: 13.8 g/dL (ref 12.0–15.0)
MCHC: 32.9 g/dL (ref 30.0–36.0)
MCV: 91.2 fl (ref 78.0–100.0)
Platelets: 279 10*3/uL (ref 150.0–400.0)
RBC: 4.61 Mil/uL (ref 3.87–5.11)
RDW: 13.8 % (ref 11.5–15.5)
WBC: 4.6 10*3/uL (ref 4.0–10.5)

## 2023-08-11 ENCOUNTER — Other Ambulatory Visit: Payer: Self-pay | Admitting: Internal Medicine

## 2023-08-11 DIAGNOSIS — Z1231 Encounter for screening mammogram for malignant neoplasm of breast: Secondary | ICD-10-CM

## 2023-08-24 NOTE — Progress Notes (Signed)
62 y.o. G0P0000 Married Caucasian female here for annual exam.   Having vaginal dryness and asking about options.   Would like a yearly pap and HR HPV.   PCP: Karie Schwalbe, MD   Patient's last menstrual period was 09/30/1997 (approximate).           Sexually active: Yes.    The current method of family planning is status post hysterectomy.    Exercising: Yes.     Running 5 days a week, total body toning 3 times a week Smoker:  no  OB History  Gravida Para Term Preterm AB Living  0 0 0 0 0 0  SAB IAB Ectopic Multiple Live Births  0 0 0 0 0     Health Maintenance: Pap:  08/30/22 neg: HR HPV neg, 08/23/21 neg: HR HPV neg History of abnormal Pap:  no MMG: 09/20/22 Breast Density Cat D, BI-RADS CAT 1 neg. Has appointment this month. Colonoscopy:   [facility] Colonoscopy          Colonoscopy (Every 10 Years) Next due on 08/21/2030    08/21/2020  COLONOSCOPY   Only the first 1 history entries have been loaded, but more history exists.           BMD:  n/a  Result  n/a  HIV: 07/05/17 NR Hep C:07/05/17 neg  Immunization History  Administered Date(s) Administered   Janssen (J&J) SARS-COV-2 Vaccination 10/13/2020   Td 06/12/2020   Tdap 05/24/2011      reports that she has never smoked. She has never been exposed to tobacco smoke. She has never used smokeless tobacco. She reports current alcohol use of about 3.0 standard drinks of alcohol per week. She reports that she does not use drugs.  Past Medical History:  Diagnosis Date   Cervical cancer ([facility]) 1998   CIS   Scarring alopecia 04/2016   Vitamin D deficiency 2008   Yeast infection    chronic yeast infection     Past Surgical History:  Procedure Laterality Date   ABDOMINAL HYSTERECTOMY     CERVICAL CONE BIOPSY  04/02/1997   Squamous cell caarcinoma in situ of cervix   COLONOSCOPY  07/2011   normal repeat in 10 years   COLPOSCOPY  01/2003   vain I   left arm surgery     1980   METATARSAL OSTEOTOMY Left  01/2015   repair of Hallux rigidus with Condylectomy   TOTAL VAGINAL HYSTERECTOMY  10/08/1997   [facility] [place], [place] [facility]    Current Outpatient Medications  Medication Sig Dispense Refill   Biotin 10 MG CAPS Take by mouth.     Calcium Carb-Cholecalciferol (CALCIUM 1000 + D PO) Take by mouth.     clobetasol (OLUX) 0.05 % topical foam Apply topically as directed. Qd up to 5 days a week to aa scalp, avoid face, groin, axilla 50 g 2   COCONUT OIL PO Take by mouth.     Flaxseed, Linseed, (FLAX SEED OIL PO) Take by mouth.     ketoconazole (NIZORAL) 2 % shampoo MASSAGE INTO SCALP AND LET SIT FOR AT LEAST 5 MINUTES BEFORE WASHING OUT, USE THREE TIMES WEEKLY. 120 mL 5   Multiple Vitamin (MULTIVITAMIN) tablet Take 1 tablet by mouth daily.     No current facility-administered medications for this visit.    Family History  Problem Relation Age of Onset   Hypertension Maternal Aunt    Pancreatic cancer Maternal Aunt 52   Thyroid disease Mother  Stomach cancer Cousin 35   Cancer Paternal Aunt        ovarian cancer    Diabetes Maternal Grandmother    Diabetes Paternal Grandmother    Heart failure Paternal Grandfather    Colon cancer Neg Hx    Esophageal cancer Neg Hx    Rectal cancer Neg Hx     Review of Systems  All other systems reviewed and are negative.   Exam:   BP 114/76 (BP Location: Left Arm, Patient Position: Sitting, Cuff Size: Normal)   Pulse 72   Ht 5\' 8"  (1.727 m)   Wt 124 lb (56.2 kg)   LMP 09/30/1997 (Approximate)   SpO2 96%   BMI 18.85 kg/m     General appearance: alert, cooperative and appears stated age Head: normocephalic, without obvious abnormality, atraumatic Neck: no adenopathy, supple, symmetrical, trachea midline and thyroid normal to inspection and palpation Lungs: clear to auscultation bilaterally Breasts: normal appearance, no masses or tenderness, No nipple retraction or dimpling, No nipple discharge or bleeding, No axillary adenopathy Heart:  regular rate and rhythm Abdomen: soft, non-tender; no masses, no organomegaly Extremities: extremities normal, atraumatic, no cyanosis or edema Skin: skin color, texture, turgor normal. No rashes or lesions Lymph nodes: cervical, supraclavicular, and axillary nodes normal. Neurologic: grossly normal  Pelvic: External genitalia:  no lesions              No abnormal inguinal nodes palpated.              Urethra:  normal appearing urethra with no masses, tenderness or lesions              Bartholins and Skenes: normal                 Vagina: normal appearing vagina with normal color and discharge, no lesions.  Atrophy noted.               Cervix: absent              Pap taken: yes Bimanual Exam:  Uterus:  absent              Adnexa: no mass, fullness, tenderness              Rectal exam: yes.  Confirms.              Anus:  normal sphincter tone, no lesions  Chaperone was present for exam:  Warren Lacy, CMA   Assessment: Well woman with gynecologic exam.  Status post [facility] for CIS.   Ovaries remain.  Hx VAIN I.  2004 Vaginal atrophy.  Hx frontal temporal alopecia.  Plan: Mammogram screening discussed. Self breast awareness reviewed. Pap and HR HPV testing.  Guidelines for Calcium, Vitamin D, regular exercise program including cardiovascular and weight bearing exercise. BMD ordered.  We discussed tx options for vaginal atrophy:  Replens and OTC vaginal lubricants, cooking oils, vaginal estrogens, Intrarosa, Osphena.  Patient will try nonprescriptions options first. Follow up yearly and prn.

## 2023-09-07 ENCOUNTER — Encounter: Payer: Self-pay | Admitting: Obstetrics and Gynecology

## 2023-09-07 ENCOUNTER — Other Ambulatory Visit (HOSPITAL_COMMUNITY)
Admission: RE | Admit: 2023-09-07 | Discharge: 2023-09-07 | Disposition: A | Discharge status: Home / Self Care | Payer: 59 | Source: Ambulatory Visit | Attending: Obstetrics and Gynecology | Admitting: Obstetrics and Gynecology

## 2023-09-07 ENCOUNTER — Ambulatory Visit: Payer: 59 | Admitting: Obstetrics and Gynecology

## 2023-09-07 VITALS — BP 114/76 | HR 72 | Ht 68.0 in | Wt 124.0 lb

## 2023-09-07 DIAGNOSIS — Z1272 Encounter for screening for malignant neoplasm of vagina: Secondary | ICD-10-CM | POA: Diagnosis present

## 2023-09-07 DIAGNOSIS — Z01419 Encounter for gynecological examination (general) (routine) without abnormal findings: Secondary | ICD-10-CM

## 2023-09-07 DIAGNOSIS — Z13820 Encounter for screening for osteoporosis: Secondary | ICD-10-CM

## 2023-09-07 DIAGNOSIS — D069 Carcinoma in situ of cervix, unspecified: Secondary | ICD-10-CM

## 2023-09-07 DIAGNOSIS — Z780 Asymptomatic menopausal state: Secondary | ICD-10-CM | POA: Diagnosis not present

## 2023-09-07 NOTE — Patient Instructions (Signed)

## 2023-09-11 LAB — CYTOLOGY - PAP
Comment: NEGATIVE
Diagnosis: NEGATIVE
High risk HPV: NEGATIVE

## 2023-09-26 ENCOUNTER — Ambulatory Visit: Payer: 59

## 2023-10-04 ENCOUNTER — Inpatient Hospital Stay: Admission: RE | Admit: 2023-10-04 | Payer: 59 | Source: Ambulatory Visit

## 2023-10-23 ENCOUNTER — Ambulatory Visit
Admission: RE | Admit: 2023-10-23 | Discharge: 2023-10-23 | Disposition: A | Discharge status: Home / Self Care | Payer: 59 | Source: Ambulatory Visit | Attending: Internal Medicine | Admitting: Internal Medicine

## 2023-10-23 DIAGNOSIS — Z1231 Encounter for screening mammogram for malignant neoplasm of breast: Secondary | ICD-10-CM | POA: Diagnosis present

## 2024-01-30 HISTORY — PX: TOTAL HIP ARTHROPLASTY: SHX124

## 2024-02-12 HISTORY — PX: TOTAL HIP ARTHROPLASTY: SHX124

## 2024-03-12 ENCOUNTER — Other Ambulatory Visit

## 2024-04-24 ENCOUNTER — Ambulatory Visit
Admission: RE | Admit: 2024-04-24 | Discharge: 2024-04-24 | Disposition: A | Discharge status: Home / Self Care | Payer: Self-pay | Source: Ambulatory Visit | Attending: Obstetrics and Gynecology | Admitting: Obstetrics and Gynecology

## 2024-04-24 DIAGNOSIS — Z780 Asymptomatic menopausal state: Secondary | ICD-10-CM | POA: Insufficient documentation

## 2024-04-25 ENCOUNTER — Ambulatory Visit: Payer: Self-pay | Admitting: Obstetrics and Gynecology

## 2024-06-24 ENCOUNTER — Ambulatory Visit (INDEPENDENT_AMBULATORY_CARE_PROVIDER_SITE_OTHER): Payer: 59 | Admitting: Internal Medicine

## 2024-06-24 ENCOUNTER — Encounter: Payer: Self-pay | Admitting: Internal Medicine

## 2024-06-24 VITALS — BP 114/70 | HR 54 | Temp 98.1°F | Ht 67.5 in | Wt 119.0 lb

## 2024-06-24 DIAGNOSIS — E785 Hyperlipidemia, unspecified: Secondary | ICD-10-CM | POA: Diagnosis not present

## 2024-06-24 DIAGNOSIS — Z0000 Encounter for general adult medical examination without abnormal findings: Secondary | ICD-10-CM | POA: Diagnosis not present

## 2024-06-24 NOTE — Progress Notes (Signed)
 Subjective:    Patient ID: Meryl LOISE Salt, female    DOB: 12-25-1960, 63 y.o.   MRN: [id number]  HPI Here for physical  Had right THR in April Dog collided with her Has recovered fairly well Walking now with weights--does exercise program also (resistance)  No other concerns  Current Outpatient Medications on File Prior to Visit  Medication Sig Dispense Refill   Biotin 10 MG CAPS Take by mouth.     Calcium Carb-Cholecalciferol (CALCIUM 1000 + D PO) Take by mouth.     clobetasol  (OLUX ) 0.05 % topical foam Apply topically as directed. Qd up to 5 days a week to aa scalp, avoid face, groin, axilla 50 g 2   COCONUT OIL PO Take by mouth.     Flaxseed, Linseed, (FLAX SEED OIL PO) Take by mouth.     Multiple Vitamin (MULTIVITAMIN) tablet Take 1 tablet by mouth daily.     No current facility-administered medications on file prior to visit.    Allergies  Allergen Reactions   Hydrocodone     Stomach upset    Past Medical History:  Diagnosis Date   Cervical cancer (HCC) 1998   CIS   Scarring alopecia 04/2016   Vitamin D deficiency 2008   Yeast infection    chronic yeast infection     Past Surgical History:  Procedure Laterality Date   ABDOMINAL HYSTERECTOMY     CERVICAL CONE BIOPSY  04/02/1997   Squamous cell caarcinoma in situ of cervix   COLONOSCOPY  07/2011   normal repeat in 10 years   COLPOSCOPY  01/2003   vain I   left arm surgery     1980   METATARSAL OSTEOTOMY Left 01/2015   repair of Hallux rigidus with Condylectomy   TOTAL HIP ARTHROPLASTY Right 02/12/2024   TOTAL HIP ARTHROPLASTY Right 01/2024   TOTAL VAGINAL HYSTERECTOMY  10/08/1997   CIS of cervix, [place] [facility]    Family History  Problem Relation Age of Onset   Hypertension Maternal Aunt    Pancreatic cancer Maternal Aunt 42   Thyroid  disease Mother    Stomach cancer Cousin 58   Cancer Paternal Aunt        ovarian cancer    Diabetes Maternal Grandmother    Diabetes Paternal Grandmother     Heart failure Paternal Grandfather    Colon cancer Neg Hx    Esophageal cancer Neg Hx    Rectal cancer Neg Hx     Social History   Socioeconomic History   Marital status: Married    Spouse name: Not on file   Number of children: 0   Years of education: Not on file   Highest education level: Not on file  Occupational History   Occupation: IT trainer    Comment: [employer]  Tobacco Use   Smoking status: Never    Passive exposure: Never   Smokeless tobacco: Never  Vaping Use   Vaping status: Never Used  Substance and Sexual Activity   Alcohol use: Yes    Alcohol/week: 3.0 standard drinks of alcohol    Types: 3 Cans of beer per week   Drug use: No   Sexual activity: Yes    Birth control/protection: Surgical    Comment: [facility]  Other Topics Concern   Not on file  Social History Narrative   Not on file   Social Drivers of Health   Financial Resource Strain: Not on file  Food Insecurity: Not on file  Transportation Needs: Not on file  Physical Activity: Not on file  Stress: Not on file  Social Connections: Not on file  Intimate Partner Violence: Not on file   Review of Systems  Constitutional:  Negative for fatigue and unexpected weight change.       Wears seat belt  HENT:  Negative for hearing loss and tinnitus.        Keeps up with dentist--does need crown  Eyes:  Negative for visual disturbance.       No diplopia or unilateral vision loss  Respiratory:  Negative for cough, chest tightness and shortness of breath.   Cardiovascular:  Negative for chest pain, palpitations and leg swelling.  Gastrointestinal:  Negative for blood in stool and constipation.       No heartburn  Endocrine: Negative for polydipsia and polyuria.  Genitourinary:  Negative for dyspareunia, dysuria and hematuria.  Musculoskeletal:  Negative for arthralgias, back pain and joint swelling.  Skin:  Negative for rash.       No suspicious lesions  Allergic/Immunologic: Negative for environmental  allergies and immunocompromised state.  Neurological:  Negative for dizziness, syncope, light-headedness and headaches.  Hematological:  Negative for adenopathy. Does not bruise/bleed easily.  Psychiatric/Behavioral:  Negative for dysphoric mood and sleep disturbance. The patient is not nervous/anxious.        Objective:   Physical Exam Constitutional:      Appearance: Normal appearance.  HENT:     Mouth/Throat:     Pharynx: No oropharyngeal exudate or posterior oropharyngeal erythema.  Eyes:     Conjunctiva/sclera: Conjunctivae normal.     Pupils: Pupils are equal, round, and reactive to light.  Cardiovascular:     Rate and Rhythm: Normal rate and regular rhythm.     Pulses: Normal pulses.     Heart sounds: No murmur heard.    No gallop.  Pulmonary:     Effort: Pulmonary effort is normal.     Breath sounds: Normal breath sounds. No wheezing or rales.  Abdominal:     Palpations: Abdomen is soft.     Tenderness: There is no abdominal tenderness.  Musculoskeletal:     Cervical back: Neck supple.     Right lower leg: No edema.     Left lower leg: No edema.  Lymphadenopathy:     Cervical: No cervical adenopathy.  Skin:    Findings: No rash.  Neurological:     General: No focal deficit present.     Mental Status: She is alert and oriented to person, place, and time.  Psychiatric:        Mood and Affect: Mood normal.        Behavior: Behavior normal.            Assessment & Plan:

## 2024-06-24 NOTE — Assessment & Plan Note (Signed)
 Low risk with high HDL Had labs at work recently

## 2024-06-24 NOTE — Assessment & Plan Note (Signed)
 Healthy Back to regular exercise Still prefers no immunizations Colon due 2031 Mammogram yearly --due December

## 2024-09-06 ENCOUNTER — Other Ambulatory Visit: Payer: Self-pay | Admitting: Obstetrics and Gynecology

## 2024-09-06 DIAGNOSIS — Z1231 Encounter for screening mammogram for malignant neoplasm of breast: Secondary | ICD-10-CM

## 2024-10-25 ENCOUNTER — Ambulatory Visit
Admission: RE | Admit: 2024-10-25 | Discharge: 2024-10-25 | Disposition: A | Discharge status: Home / Self Care | Source: Ambulatory Visit | Attending: Obstetrics and Gynecology | Admitting: Obstetrics and Gynecology

## 2024-10-25 DIAGNOSIS — Z1231 Encounter for screening mammogram for malignant neoplasm of breast: Secondary | ICD-10-CM | POA: Diagnosis present

## 2024-10-28 ENCOUNTER — Encounter

## 2024-11-03 ENCOUNTER — Ambulatory Visit: Payer: Self-pay | Admitting: Obstetrics and Gynecology

## 2025-03-05 ENCOUNTER — Ambulatory Visit: Admitting: Obstetrics and Gynecology

## 2025-06-03 ENCOUNTER — Encounter: Admitting: Nurse Practitioner
